# Patient Record
Sex: Female | Born: 1985 | Race: Black or African American | Hispanic: No | Marital: Single | Smoking: Former smoker
Health system: Southern US, Community
[De-identification: ages and names within clinical notes are randomized; demographics above are authoritative.]

## PROBLEM LIST (undated history)

## (undated) DIAGNOSIS — F640 Transsexualism: Secondary | ICD-10-CM

## (undated) DIAGNOSIS — Z789 Other specified health status: Secondary | ICD-10-CM

## (undated) HISTORY — PX: THROAT SURGERY: SHX803

## (undated) HISTORY — PX: TRACHEAL SURGERY: SHX1096

---

## 2012-12-18 ENCOUNTER — Emergency Department (HOSPITAL_COMMUNITY)
Admission: EM | Admit: 2012-12-18 | Discharge: 2012-12-18 | Disposition: A | Discharge status: Home / Self Care | Payer: Self-pay | Attending: Emergency Medicine | Admitting: Emergency Medicine

## 2012-12-18 ENCOUNTER — Encounter (HOSPITAL_COMMUNITY): Payer: Self-pay | Admitting: *Deleted

## 2012-12-18 DIAGNOSIS — J0300 Acute streptococcal tonsillitis, unspecified: Secondary | ICD-10-CM

## 2012-12-18 DIAGNOSIS — F17200 Nicotine dependence, unspecified, uncomplicated: Secondary | ICD-10-CM | POA: Insufficient documentation

## 2012-12-18 DIAGNOSIS — J020 Streptococcal pharyngitis: Secondary | ICD-10-CM | POA: Insufficient documentation

## 2012-12-18 LAB — RAPID STREP SCREEN (MED CTR MEBANE ONLY): Streptococcus, Group A Screen (Direct): POSITIVE — AB

## 2012-12-18 MED ORDER — PENICILLIN V POTASSIUM 500 MG PO TABS: 250.0000 mg | ORAL_TABLET | Freq: Four times a day (QID) | ORAL | Status: DC

## 2012-12-18 MED ORDER — PENICILLIN V POTASSIUM 250 MG PO TABS: 250.0000 mg | ORAL_TABLET | Freq: Four times a day (QID) | ORAL | Status: DC

## 2012-12-18 NOTE — ED Provider Notes (Signed)
History     CSN: [id number]  Arrival date & time 12/18/12  0707   First MD Initiated Contact with Patient 12/18/12 [phone]      Chief Complaint  Patient presents with  . Sore Throat     HPI Pt reports sore throat x several days. Denies fever, denies cough. Pts throat red. Tonsils swollen, not touching. NAD noted.  Patient denies fever.  States she has pain with swallowing.  Able handle secretions.  History reviewed. No pertinent past medical history.  History reviewed. No pertinent past surgical history.  History reviewed. No pertinent family history.  History  Substance Use Topics  . Smoking status: Current Every Day Smoker -- 1.0 packs/day  . Smokeless tobacco: Not on file  . Alcohol Use: No    OB History    Grav Para Term Preterm Abortions TAB SAB Ect Mult Living                  Review of Systems All other systems reviewed and are negative Allergies  Review of patient's allergies indicates no known allergies.  Home Medications   Current Outpatient Rx  Name  Route  Sig  Dispense  Refill  . PENICILLIN V POTASSIUM 500 MG PO TABS   Oral   Take 0.5 tablets (250 mg total) by mouth 4 (four) times daily.   30 tablet   0     BP 140/81  Pulse 93  Temp 98 F (36.7 C) (Oral)  Resp 16  SpO2 99%  Physical Exam  Nursing note and vitals reviewed. Constitutional: She is oriented to person, place, and time. She appears well-developed and well-nourished. No distress.  HENT:  Head: Normocephalic and atraumatic.  Mouth/Throat: Posterior oropharyngeal edema and posterior oropharyngeal erythema present. No oropharyngeal exudate or tonsillar abscesses.  Eyes: Pupils are equal, round, and reactive to light.  Neck: Normal range of motion.  Cardiovascular: Normal rate and intact distal pulses.   Pulmonary/Chest: No respiratory distress.  Abdominal: Normal appearance. She exhibits no distension.  Musculoskeletal: Normal range of motion.  Neurological: She is alert and  oriented to person, place, and time. No cranial nerve deficit.  Skin: Skin is warm and dry. No rash noted.  Psychiatric: She has a normal mood and affect. Her behavior is normal.    ED Course  Procedures (including critical care time)  Labs Reviewed  RAPID STREP SCREEN - Abnormal; Notable for the following:    Streptococcus, Group A Screen (Direct) POSITIVE (*)     All other components within normal limits   No results found.   1. Strep tonsillitis       MDM         Nelia Shi, MD 12/18/12 [phone]

## 2012-12-18 NOTE — ED Notes (Signed)
Pt reports sore throat x several days.  Denies fever, denies cough.  Pts throat red.  Tonsils swollen, not touching.  NAD noted.

## 2013-08-24 ENCOUNTER — Encounter (HOSPITAL_COMMUNITY): Payer: Self-pay | Admitting: *Deleted

## 2013-08-24 ENCOUNTER — Emergency Department (HOSPITAL_COMMUNITY)
Admission: EM | Admit: 2013-08-24 | Discharge: 2013-08-24 | Disposition: A | Discharge status: Home / Self Care | Payer: Self-pay | Attending: Emergency Medicine | Admitting: Emergency Medicine

## 2013-08-24 DIAGNOSIS — Z87891 Personal history of nicotine dependence: Secondary | ICD-10-CM | POA: Insufficient documentation

## 2013-08-24 DIAGNOSIS — R21 Rash and other nonspecific skin eruption: Secondary | ICD-10-CM | POA: Insufficient documentation

## 2013-08-24 MED ORDER — PERMETHRIN 5 % EX CREA: TOPICAL_CREAM | CUTANEOUS | Status: DC

## 2013-08-24 NOTE — ED Notes (Signed)
Pt is here with rash to hands and penis area

## 2013-08-24 NOTE — ED Provider Notes (Signed)
CSN: [id number]     Arrival date & time 08/24/13  1331 History   First MD Initiated Contact with Patient 08/24/13 1358     Chief Complaint  Patient presents with  . Rash   (Consider location/radiation/quality/duration/timing/severity/associated sxs/prior Treatment) HPI Comments: Patient is a 27 year old female presenting to the emergency department with 2 complaints. The first complaint is a pruritic nondraining rash on bilateral hands that has been present for the last 3 weeks since a visit to [place]. Patient states she was seen initially and provided Bactrim for infection which he has been taking but has not noticed any improvement in the pruritic rash. Patient states rash is worse at night. No alleviating factors. Patient's second complaint is a painless, nonerythematous, nondraining lesion found on the shaft of his penis today. Patient states he has never noticed this before and is having no penile, scrotal, testicular pain or swelling. He denies any recent sexual intercourse protected are otherwise stating he has no concern for sexually transmitted diseases. He denies any penile discharge, urinary symptoms, fever or chills.  Patient is a 27 y.o. female presenting with rash.  Rash Associated symptoms: no fever     History reviewed. No pertinent past medical history. History reviewed. No pertinent past surgical history. No family history on file. History  Substance Use Topics  . Smoking status: Former Smoker -- 1.00 packs/day    Quit date: 02/15/2013  . Smokeless tobacco: Not on file  . Alcohol Use: No    Review of Systems  Constitutional: Negative for fever and chills.  Genitourinary: Negative.  Negative for penile swelling, scrotal swelling, penile pain and testicular pain.  Skin: Positive for rash.    Allergies  Review of patient's allergies indicates no known allergies.  Home Medications   Current Outpatient Rx  Name  Route  Sig  Dispense  Refill  . permethrin  (ELIMITE) 5 % cream      Apply to affected area once   60 g   0    BP 156/86  Pulse 80  Temp(Src) 98.9 F (37.2 C) (Oral)  Resp 16  SpO2 99% Physical Exam  Constitutional: He is oriented to person, place, and time. He appears well-developed and well-nourished. No distress.  HENT:  Head: Normocephalic and atraumatic.  Right Ear: External ear normal.  Left Ear: External ear normal.  Nose: Nose normal.  Eyes: Conjunctivae are normal.  Neck: Neck supple.  Pulmonary/Chest: Effort normal.  Genitourinary: Testes normal and penis normal. Circumcised. No phimosis, paraphimosis, hypospadias, penile erythema or penile tenderness. No discharge found.  Small < 0.72mm flesh colored non-tender papule on penis, no ulcers, erythema, warmth.   Neurological: He is alert and oriented to person, place, and time.  Skin: Skin is warm and dry. Rash noted. Rash is maculopapular. He is not diaphoretic.  Small erythematous maculopapules on bilateral hands.   Psychiatric: He has a normal mood and affect.    ED Course  Procedures (including critical care time) Labs Review Labs Reviewed - No data to display Imaging Review No results found.  MDM   1. Rash    Afebrile, NAD, non-toxic appearing, AAOx4. No evidence of SJS or necrotizing fasciitis. Due to pruritic and not painful nature of blisters do not suspect pemphigus vulgaris. Pustules do resemble scabies, along with history. No blisters, no pustules, no warmth, no draining sinus tracts, no superficial abscesses, no bullous impetigo, no vesicles, no desquamation, no target lesions with dusky purpura or a central bulla. Not tender to  touch. Do no suspect sexually transmitted disease causing single flesh colored papule on penis. No other GU findings. Return precautions discussed. Patient is agreeable to plan. Patient d/w with Dr. Blinda Leatherwood, agrees with plan. Patient is stable at time of discharge         Jeannetta Ellis, PA-C 08/24/13 1537

## 2013-08-27 NOTE — ED Provider Notes (Signed)
Medical screening examination/treatment/procedure(s) were performed by non-physician practitioner and as supervising physician I was immediately available for consultation/collaboration.   Gilda Crease, MD 08/27/13 [phone]

## 2014-08-09 ENCOUNTER — Emergency Department (HOSPITAL_COMMUNITY)
Admission: EM | Admit: 2014-08-09 | Discharge: 2014-08-09 | Disposition: A | Discharge status: Home / Self Care | Payer: Self-pay | Attending: Emergency Medicine | Admitting: Emergency Medicine

## 2014-08-09 ENCOUNTER — Encounter (HOSPITAL_COMMUNITY): Payer: Self-pay | Admitting: Emergency Medicine

## 2014-08-09 DIAGNOSIS — N390 Urinary tract infection, site not specified: Secondary | ICD-10-CM | POA: Insufficient documentation

## 2014-08-09 DIAGNOSIS — Z79899 Other long term (current) drug therapy: Secondary | ICD-10-CM | POA: Insufficient documentation

## 2014-08-09 DIAGNOSIS — Z87891 Personal history of nicotine dependence: Secondary | ICD-10-CM | POA: Insufficient documentation

## 2014-08-09 DIAGNOSIS — R369 Urethral discharge, unspecified: Secondary | ICD-10-CM | POA: Insufficient documentation

## 2014-08-09 LAB — URINALYSIS, ROUTINE W REFLEX MICROSCOPIC
Bilirubin Urine: NEGATIVE
GLUCOSE, UA: NEGATIVE mg/dL
HGB URINE DIPSTICK: NEGATIVE
Ketones, ur: 15 mg/dL — AB
Nitrite: NEGATIVE
Protein, ur: NEGATIVE mg/dL
SPECIFIC GRAVITY, URINE: 1.029 (ref 1.005–1.030)
Urobilinogen, UA: 1 mg/dL (ref 0.0–1.0)
pH: 6 (ref 5.0–8.0)

## 2014-08-09 LAB — RPR: RPR Ser Ql: REACTIVE — AB

## 2014-08-09 LAB — URINE MICROSCOPIC-ADD ON

## 2014-08-09 LAB — RPR TITER

## 2014-08-09 MED ORDER — CEFTRIAXONE SODIUM 250 MG IJ SOLR
250.0000 mg | Freq: Once | INTRAMUSCULAR | Status: AC
  Administered 2014-08-09: 250 mg via INTRAMUSCULAR
  Filled 2014-08-09: qty 250

## 2014-08-09 MED ORDER — AZITHROMYCIN 250 MG PO TABS
1000.0000 mg | ORAL_TABLET | Freq: Once | ORAL | Status: AC
  Administered 2014-08-09: 1000 mg via ORAL
  Filled 2014-08-09: qty 4

## 2014-08-09 MED ORDER — CIPROFLOXACIN HCL 500 MG PO TABS
500.0000 mg | ORAL_TABLET | Freq: Two times a day (BID) | ORAL | Status: DC
Start: 2014-08-09 — End: 2014-08-25

## 2014-08-09 MED ORDER — LIDOCAINE HCL (PF) 1 % IJ SOLN
INTRAMUSCULAR | Status: AC
  Filled 2014-08-09: qty 5

## 2014-08-09 NOTE — ED Notes (Signed)
Patient presents stating having a vaginal discharge

## 2014-08-09 NOTE — ED Provider Notes (Signed)
TIME SEEN: 7:34 AM  CHIEF COMPLAINT: Penile discharge  HPI: Patient is a 28 year old transgender female who has not undergone gender reassignment surgery who presents to the emergency department with white/yellow penile discharge that started yesterday. She states she has noticed an uncomfortable feeling when she urinates and burning. No hematuria. She is sexually active with one female partner. Denies fevers, chills, vomiting or diarrhea. Patient reports she has had syphilis in the past has been treated. She states she is concerned she may have a sexually transmitted disease.  Please note, triage notes are incorrect. Patient is not having vaginal discharge. She has not undergone gender reassignment surgery and still has a penis.  ROS: See HPI Constitutional: no fever  Eyes: no drainage  ENT: no runny nose   Cardiovascular:  no chest pain  Resp: no SOB  GI: no vomiting GU:  dysuria Integumentary: no rash  Allergy: no hives  Musculoskeletal: no leg swelling  Neurological: no slurred speech ROS otherwise negative  PAST MEDICAL HISTORY/PAST SURGICAL HISTORY:  History reviewed. No pertinent past medical history.  MEDICATIONS:  Prior to Admission medications   Medication Sig Start Date End Date Taking? Authorizing Provider  permethrin (ELIMITE) 5 % cream Apply to affected area once 08/24/13   Lise Auer Piepenbrink, PA-C    ALLERGIES:  No Known Allergies  SOCIAL HISTORY:  History  Substance Use Topics  . Smoking status: Former Smoker -- 1.00 packs/day    Quit date: 02/15/2013  . Smokeless tobacco: Never Used  . Alcohol Use: Yes     Comment: ocassionally    FAMILY HISTORY: History reviewed. No pertinent family history.  EXAM: BP 140/91  Pulse 75  Temp(Src) 98.6 F (37 C) (Oral)  Resp 18  Ht  (1.753 m)  Wt 225 lb (102.059 kg)  BMI 33.21 kg/m2  SpO2 100% CONSTITUTIONAL: Alert and oriented and responds appropriately to questions. Well-appearing; well-nourished HEAD:  Normocephalic EYES: Conjunctivae clear, PERRL ENT: normal nose; no rhinorrhea; moist mucous membranes; pharynx without lesions noted NECK: Supple, no meningismus, no LAD  CARD: RRR; S1 and S2 appreciated; no murmurs, no clicks, no rubs, no gallops RESP: Normal chest excursion without splinting or tachypnea; breath sounds clear and equal bilaterally; no wheezes, no rhonchi, no rales,  ABD/GI: Normal bowel sounds; non-distended; soft, non-tender, no rebound, no guarding GU:  Normal external genitalia, small amount of clear discharge from patient's penis, no testicular masses or pain with palpation, no scrotal masses, no perineal warmth or erythema or crepitus or induration BACK:  The back appears normal and is non-tender to palpation, there is no CVA tenderness EXT: Normal ROM in all joints; non-tender to palpation; no edema; normal capillary refill; no cyanosis    SKIN: Normal color for age and race; warm NEURO: Moves all extremities equally PSYCH: The patient's mood and manner are appropriate. Grooming and personal hygiene are appropriate.  MEDICAL DECISION MAKING: Patient here with dysuria and penile discharge. Concern for STD. We'll send urinalysis, urine gonorrhea and Chlamydia tests. Have recommended HIV and syphilis tests which patient agrees to. Will empirically treat for coronary Chlamydia with ceftriaxone and azithromycin. Urine culture pending. We'll discharge home on Cipro for possible UTI as well. Have discussed return precautions and supportive care instructions. Patient verbalizes understanding and is comfortable with plan.       Layla Maw Daneshia Tavano, DO 08/09/14 [phone]

## 2014-08-09 NOTE — Discharge Instructions (Signed)
You may have a urinary tract infection. We're treating you with an antibiotic called Cipro twice a day for the next 7 days. Please take all of this medication. There is also concerned that you may have been exposed to sexual transmitted disease. We have tested him for gonorrhea, Chlamydia, syphilis and HIV. We have treated you with medications in the ED for gonorrhea and Chlamydia. Please abstain from sexual activity until you know the results of your test or until your partner has been tested and treated as well.    Urinary Tract Infection Urinary tract infections (UTIs) can develop anywhere along your urinary tract. Your urinary tract is your body's drainage system for removing wastes and extra water. Your urinary tract includes two kidneys, two ureters, a bladder, and a urethra. Your kidneys are a pair of bean-shaped organs. Each kidney is about the size of your fist. They are located below your ribs, one on each side of your spine. CAUSES Infections are caused by microbes, which are microscopic organisms, including fungi, viruses, and bacteria. These organisms are so small that they can only be seen through a microscope. Bacteria are the microbes that most commonly cause UTIs. SYMPTOMS  Symptoms of UTIs may vary by age and gender of the patient and by the location of the infection. Symptoms in [place] women typically include a frequent and intense urge to urinate and a painful, burning feeling in the bladder or urethra during urination. Older women and men are more likely to be tired, shaky, and weak and have muscle aches and abdominal pain. A fever may mean the infection is in your kidneys. Other symptoms of a kidney infection include pain in your back or sides below the ribs, nausea, and vomiting. DIAGNOSIS To diagnose a UTI, your caregiver will ask you about your symptoms. Your caregiver also will ask to provide a urine sample. The urine sample will be tested for bacteria and white blood cells. White  blood cells are made by your body to help fight infection. TREATMENT  Typically, UTIs can be treated with medication. Because most UTIs are caused by a bacterial infection, they usually can be treated with the use of antibiotics. The choice of antibiotic and length of treatment depend on your symptoms and the type of bacteria causing your infection. HOME CARE INSTRUCTIONS  If you were prescribed antibiotics, take them exactly as your caregiver instructs you. Finish the medication even if you feel better after you have only taken some of the medication.  Drink enough water and fluids to keep your urine clear or pale yellow.  Avoid caffeine, tea, and carbonated beverages. They tend to irritate your bladder.  Empty your bladder often. Avoid holding urine for long periods of time.  Empty your bladder before and after sexual intercourse.  After a bowel movement, women should cleanse from front to back. Use each tissue only once. SEEK MEDICAL CARE IF:   You have back pain.  You develop a fever.  Your symptoms do not begin to resolve within 3 days. SEEK IMMEDIATE MEDICAL CARE IF:   You have severe back pain or lower abdominal pain.  You develop chills.  You have nausea or vomiting.  You have continued burning or discomfort with urination. MAKE SURE YOU:   Understand these instructions.  Will watch your condition.  Will get help right away if you are not doing well or get worse. Document Released: 09/13/2005 Document Revised: 06/04/2012 Document Reviewed: 01/12/2012 [facility] Patient Information 2015 [place], [place]. This information is  not intended to replace advice given to you by your health care provider. Make sure you discuss any questions you have with your health care provider.   Possible Sexually Transmitted Disease A sexually transmitted disease (STD) is a disease or infection that may be passed (transmitted) from person to person, usually during sexual activity. This may  happen by way of saliva, semen, blood, vaginal mucus, or urine. Common STDs include:   Gonorrhea.   Chlamydia.   Syphilis.   HIV and AIDS.   Genital herpes.   Hepatitis B and C.   Trichomonas.   Human papillomavirus (HPV).   Pubic lice.   Scabies.  Mites.  Bacterial vaginosis. WHAT ARE CAUSES OF STDs? An STD may be caused by bacteria, a virus, or parasites. STDs are often transmitted during sexual activity if one person is infected. However, they may also be transmitted through nonsexual means. STDs may be transmitted after:   Sexual intercourse with an infected person.   Sharing sex toys with an infected person.   Sharing needles with an infected person or using unclean piercing or tattoo needles.  Having intimate contact with the genitals, mouth, or rectal areas of an infected person.   Exposure to infected fluids during birth. WHAT ARE THE SIGNS AND SYMPTOMS OF STDs? Different STDs have different symptoms. Some people may not have any symptoms. If symptoms are present, they may include:   Painful or bloody urination.   Pain in the pelvis, abdomen, vagina, anus, throat, or eyes.   A skin rash, itching, or irritation.  Growths, ulcerations, blisters, or sores in the genital and anal areas.  Abnormal vaginal discharge with or without bad odor.   Penile discharge in men.   Fever.   Pain or bleeding during sexual intercourse.   Swollen glands in the groin area.   Yellow skin and eyes (jaundice). This is seen with hepatitis.   Swollen testicles.  Infertility.  Sores and blisters in the mouth. HOW ARE STDs DIAGNOSED? To make a diagnosis, your health care provider may:   Take a medical history.   Perform a physical exam.   Take a sample of any discharge to examine.  Swab the throat, cervix, opening to the penis, rectum, or vagina for testing.  Test a sample of your first morning urine.   Perform blood tests.   Perform  a Pap test, if this applies.   Perform a colposcopy.   Perform a laparoscopy.  HOW ARE STDs TREATED? Treatment depends on the STD. Some STDs may be treated but not cured.   Chlamydia, gonorrhea, trichomonas, and syphilis can be cured with antibiotic medicine.   Genital herpes, hepatitis, and HIV can be treated, but not cured, with prescribed medicines. The medicines lessen symptoms.   Genital warts from HPV can be treated with medicine or by freezing, burning (electrocautery), or surgery. Warts may come back.   HPV cannot be cured with medicine or surgery. However, abnormal areas may be removed from the cervix, vagina, or vulva.   If your diagnosis is confirmed, your recent sexual partners need treatment. This is true even if they are symptom-free or have a negative culture or evaluation. They should not have sex until their health care providers say it is okay. HOW CAN I REDUCE MY RISK OF GETTING AN STD? Take these steps to reduce your risk of getting an STD:  Use latex condoms, dental dams, and water-soluble lubricants during sexual activity. Do not use petroleum jelly or oils.  Avoid having  multiple sex partners.  Do not have sex with someone who has other sex partners.  Do not have sex with anyone you do not know or who is at high risk for an STD.  Avoid risky sex practices that can break your skin.  Do not have sex if you have open sores on your mouth or skin.  Avoid drinking too much alcohol or taking illegal drugs. Alcohol and drugs can affect your judgment and put you in a vulnerable position.  Avoid engaging in oral and anal sex acts.  Get vaccinated for HPV and hepatitis. If you have not received these vaccines in the past, talk to your health care provider about whether one or both might be right for you.   If you are at risk of being infected with HIV, it is recommended that you take a prescription medicine daily to prevent HIV infection. This is called  pre-exposure prophylaxis (PrEP). You are considered at risk if:  You are a man who has sex with other men (MSM).  You are a heterosexual man or woman and are sexually active with more than one partner.  You take drugs by injection.  You are sexually active with a partner who has HIV.  Talk with your health care provider about whether you are at high risk of being infected with HIV. If you choose to begin PrEP, you should first be tested for HIV. You should then be tested every 3 months for as long as you are taking PrEP.  WHAT SHOULD I DO IF I THINK I HAVE AN STD?  See your health care provider.   Tell your sexual partner(s). They should be tested and treated for any STDs.  Do not have sex until your health care provider says it is okay. WHEN SHOULD I GET IMMEDIATE MEDICAL CARE? Contact your health care provider right away if:   You have severe abdominal pain.  You are a man and notice swelling or pain in your testicles.  You are a woman and notice swelling or pain in your vagina. Document Released: 02/24/2003 Document Revised: 12/09/2013 Document Reviewed: 06/24/2013 [facility] Patient Information 2015 [place], [place]. This information is not intended to replace advice given to you by your health care provider. Make sure you discuss any questions you have with your health care provider.

## 2014-08-09 NOTE — ED Notes (Signed)
Pt rocephin mixed lidocaine for injection

## 2014-08-10 ENCOUNTER — Telehealth (HOSPITAL_BASED_OUTPATIENT_CLINIC_OR_DEPARTMENT_OTHER): Payer: Self-pay | Admitting: Emergency Medicine

## 2014-08-10 LAB — GC/CHLAMYDIA PROBE AMP
CT Probe RNA: NEGATIVE
GC Probe RNA: POSITIVE — AB

## 2014-08-10 NOTE — Telephone Encounter (Signed)
Post ED Visit - Positive Culture Follow-up: Successful Patient Follow-Up  Culture assessed and recommendations reviewed by:  Wes Dulaney, Pharm.D., BCPS  Celedonio Miyamoto, Pharm.D., BCPS  Georgina Pillion, [address].D., BCPS  [place], [address].D., BCPS, AAHIVP  Estella Husk, Pharm.D., BCPS, AAHIVP  Red Christians, Pharm.D.  Tennis Must, [place].D.  Positive RPR culture   Patient discharged without antimicrobial prescription and treatment is now indicated  Organism is resistant to prescribed ED discharge antimicrobial  Patient with positive blood cultures    chart sent to EDP for treatment  Changes discussed with ED provider:  New antibiotic prescription  Called to   [facility] patient, date   , time    Berle Mull 08/10/2014, 11:50 AM

## 2014-08-11 ENCOUNTER — Telehealth: Payer: Self-pay | Admitting: *Deleted

## 2014-08-11 NOTE — Telephone Encounter (Signed)
Pt requested lab results, aware of labs not sign by PCP will call back

## 2014-08-12 ENCOUNTER — Telehealth (HOSPITAL_BASED_OUTPATIENT_CLINIC_OR_DEPARTMENT_OTHER): Payer: Self-pay | Admitting: Emergency Medicine

## 2014-08-12 ENCOUNTER — Telehealth (HOSPITAL_COMMUNITY): Payer: Self-pay

## 2014-08-12 NOTE — Telephone Encounter (Signed)
Post ED Visit - Positive Culture Follow-up  Culture report reviewed by antimicrobial stewardship pharmacist:  Wes Dulaney, Pharm.D., BCPS  Celedonio Miyamoto, Pharm.D., BCPS  Georgina Pillion, Pharm.D., BCPS  [place], [address].D., BCPS, AAHIVP  Estella Husk, Pharm.D., BCPS, AAHIVP  Red Christians, Pharm.D.  Cassie Roseanne [place], Pharm.D.  Positive gonorrhea culture Treated with Zithromax and Rocephin, organism sensitive to the same and no further patient follow-up is required at this time.  Berle Mull 08/12/2014, 8:44 AM

## 2014-08-24 ENCOUNTER — Encounter (HOSPITAL_COMMUNITY): Payer: Self-pay | Admitting: Emergency Medicine

## 2014-08-24 ENCOUNTER — Emergency Department (HOSPITAL_COMMUNITY)
Admission: EM | Admit: 2014-08-24 | Discharge: 2014-08-24 | Disposition: A | Discharge status: Home / Self Care | Payer: Self-pay | Attending: Emergency Medicine | Admitting: Emergency Medicine

## 2014-08-24 DIAGNOSIS — T7840XA Allergy, unspecified, initial encounter: Secondary | ICD-10-CM

## 2014-08-24 DIAGNOSIS — IMO0002 Reserved for concepts with insufficient information to code with codable children: Secondary | ICD-10-CM | POA: Insufficient documentation

## 2014-08-24 DIAGNOSIS — R21 Rash and other nonspecific skin eruption: Secondary | ICD-10-CM | POA: Insufficient documentation

## 2014-08-24 DIAGNOSIS — Z792 Long term (current) use of antibiotics: Secondary | ICD-10-CM | POA: Insufficient documentation

## 2014-08-24 DIAGNOSIS — I10 Essential (primary) hypertension: Secondary | ICD-10-CM | POA: Insufficient documentation

## 2014-08-24 DIAGNOSIS — Z79899 Other long term (current) drug therapy: Secondary | ICD-10-CM | POA: Insufficient documentation

## 2014-08-24 DIAGNOSIS — Z87891 Personal history of nicotine dependence: Secondary | ICD-10-CM | POA: Insufficient documentation

## 2014-08-24 MED ORDER — PREDNISONE 20 MG PO TABS
40.0000 mg | ORAL_TABLET | Freq: Once | ORAL | Status: AC
  Administered 2014-08-24: 40 mg via ORAL
  Filled 2014-08-24: qty 2

## 2014-08-24 MED ORDER — FAMOTIDINE 20 MG PO TABS: 20.0000 mg | ORAL_TABLET | Freq: Two times a day (BID) | ORAL | Status: DC

## 2014-08-24 MED ORDER — DIPHENHYDRAMINE HCL 25 MG PO TABS: 25.0000 mg | ORAL_TABLET | Freq: Four times a day (QID) | ORAL | Status: DC | PRN

## 2014-08-24 MED ORDER — PREDNISONE 20 MG PO TABS
40.0000 mg | ORAL_TABLET | Freq: Every day | ORAL | Status: DC
Start: 2014-08-24 — End: 2017-09-16

## 2014-08-24 MED ORDER — DIPHENHYDRAMINE HCL 25 MG PO CAPS
25.0000 mg | ORAL_CAPSULE | Freq: Once | ORAL | Status: AC
  Administered 2014-08-24: 25 mg via ORAL
  Filled 2014-08-24: qty 1

## 2014-08-24 NOTE — ED Provider Notes (Signed)
CSN: [id number]     Arrival date & time 08/24/14  3474 History   First MD Initiated Contact with Patient 08/24/14 1010     Chief Complaint  Patient presents with  . Allergic Reaction     (Consider location/radiation/quality/duration/timing/severity/associated sxs/prior Treatment) HPI Comments: 28 year old with a history of no significant allergies who presents with a complaint of having a rash especially on the inside of her right proximal thigh, possible swelling of her lower lip and a feeling of a lump in her throat area and this came on after playing volleyball last night. The patient states that there has been no other topical or ingested exposures that she is aware of. Denies change in voice, wheezing, shortness of breath, chest pain or itching except for that one spot on her inner thigh. Nothing makes this better or worse, no medications given prior to arrival.  Patient is a 28 y.o. female presenting with allergic reaction. The history is provided by the patient.  Allergic Reaction   History reviewed. No pertinent past medical history. Past Surgical History  Procedure Laterality Date  . Transgender     History reviewed. No pertinent family history. History  Substance Use Topics  . Smoking status: Former Smoker -- 1.00 packs/day    Quit date: 02/15/2013  . Smokeless tobacco: Never Used  . Alcohol Use: Yes     Comment: ocassionally    Review of Systems  All other systems reviewed and are negative.     Allergies  Review of patient's allergies indicates no known allergies.  Home Medications   Prior to Admission medications   Medication Sig Start Date End Date Taking? Authorizing Provider  ciprofloxacin (CIPRO) 500 MG tablet Take 1 tablet (500 mg total) by mouth 2 (two) times daily. 08/09/14   Kristen N Ward, DO  diphenhydrAMINE (BENADRYL) 25 MG tablet Take 1 tablet (25 mg total) by mouth every 6 (six) hours as needed for itching (Rash). 08/24/14   Vida Roller, MD   famotidine (PEPCID) 20 MG tablet Take 1 tablet (20 mg total) by mouth 2 (two) times daily. 08/24/14   Vida Roller, MD  permethrin (ELIMITE) 5 % cream Apply to affected area once 08/24/13   Victorino Dike L Piepenbrink, PA-C  predniSONE (DELTASONE) 20 MG tablet Take 2 tablets (40 mg total) by mouth daily. 08/24/14   Vida Roller, MD   BP 143/90  Pulse 76  Temp(Src) 97.6 F (36.4 C) (Oral)  Resp 18  SpO2 97% Physical Exam  Nursing note and vitals reviewed. Constitutional: He appears well-developed and well-nourished. No distress.  HENT:  Head: Normocephalic and atraumatic.  Mouth/Throat: Oropharynx is clear and moist. No oropharyngeal exudate.  Oropharynx is patent, clear, moist without any swelling erythema edema or asymmetry  Eyes: Conjunctivae and EOM are normal. Pupils are equal, round, and reactive to light. Right eye exhibits no discharge. Left eye exhibits no discharge. No scleral icterus.  Neck: Normal range of motion. Neck supple. No JVD present. No thyromegaly present.  Very supple neck with no lymphadenopathy or stiffness  Cardiovascular: Normal rate, regular rhythm, normal heart sounds and intact distal pulses.  Exam reveals no gallop and no friction rub.   No murmur heard. Pulmonary/Chest: Effort normal and breath sounds normal. No respiratory distress. He has no wheezes. He has no rales.  Musculoskeletal: Normal range of motion. He exhibits no edema and no tenderness.  Lymphadenopathy:    He has no cervical adenopathy.  Neurological: He is alert. Coordination normal.  Skin:  Skin is warm and dry. Rash noted. No erythema.  Small area of urticaria to the right inner thigh  Psychiatric: He has a normal mood and affect. His behavior is normal.    ED Course  Procedures (including critical care time) Labs Review Labs Reviewed - No data to display  Imaging Review No results found.    MDM   Final diagnoses:  Allergic reaction, initial encounter    Possible early allergic  reaction, the patient appears totally stable and has no airway issues. Has been eating and drinking, breathing and speaking without difficulty, no signs of airway swelling or compromise, stable for discharge on the following medications.   Meds given in ED:  Medications  predniSONE (DELTASONE) tablet 40 mg (not administered)  diphenhydrAMINE (BENADRYL) capsule 25 mg (not administered)    New Prescriptions   DIPHENHYDRAMINE (BENADRYL) 25 MG TABLET    Take 1 tablet (25 mg total) by mouth every 6 (six) hours as needed for itching (Rash).   FAMOTIDINE (PEPCID) 20 MG TABLET    Take 1 tablet (20 mg total) by mouth 2 (two) times daily.   PREDNISONE (DELTASONE) 20 MG TABLET    Take 2 tablets (40 mg total) by mouth daily.        Vida Roller, MD 08/24/14 1018

## 2014-08-24 NOTE — ED Notes (Signed)
Per pt sts since last night she began to have hives all over, lip swelling, and feels like she has a lump in her throat. Denies any new meds or foods.

## 2014-08-24 NOTE — Discharge Instructions (Signed)
You have been diagnosed with an allergic reaction. Usually allergic reactions like this are caused by exposures to something that you either ate or touched or smelled.  It may be related to a number of different exposures including a new perfume, topical creams, soaps, detergents, linens, clothing, medications. Occasionally we do not find an answer for why there is an allergic reaction. These are treated the same way including Benadryl as needed for itching and rash. (This can be used up to 50 mg every 6 hours as needed).  Pepcid 20 mg every night and prednisone once a day for 5 days. Please do not take the Benadryl and drive or take care of children or other imported duties as the Benadryl can make you sleepy.   ° °If you should develop severe or worsening symptoms including difficulty breathing, difficulty swallowing, wheezing or increased coughing or a rash that developed on the inside of your mouth or a worsening rash on your skin, return to the hospital immediately for a recheck. Please call your Dr. in the morning for a recheck in 2 days if you are still having symptoms. If you do not have a Dr. see the list below.  If we have identified the source of your allergic reaction, please avoid this at all costs. This means stopping the medication if it is a new medication or a voiding topical exposures such as creams lotions body soaps or deodorants if this is the source. ° °Allergic Reaction, Mild to Moderate °Allergies may happen from anything your body is sensitive to. This may be food, medications, pollens, chemicals, and nearly anything around you in everyday life that produces allergens. An allergen is anything that causes an allergy producing substance. Allergens cause your body to release allergic antibodies. Through a chain of events, they cause a release of histamine into the blood stream. Histamines are meant to protect you, but they also cause your discomfort. This is why antihistamines are often used  for allergies. Heredity is often a factor in causing allergic reactions. This means you may have some of the same allergies as your parents. °Allergies happen in all age groups. You may have some idea of what caused your reaction. There are many allergens around us. It may be difficult to know what caused your reaction. If this is a first time event, it may never happen again. Allergies cannot be cured but can be controlled with medications. °SYMPTOMS  °You may get some or all of the following problems from allergies. °· Swelling and itching in and around the mouth.  °· Tearing, itchy eyes.  °· Nasal congestion and runny nose.  °· Sneezing and coughing.  °· An itchy red rash or hives.  °· Vomiting or diarrhea.  °· Difficulty breathing.  °Seasonal allergies occur in all age groups. They are seasonal because they usually occur during the same season every year. They may be a reaction to molds, grass pollens, or tree pollens. Other causes of allergies are house dust mite allergens, pet dander and mold spores. These are just a common few of the thousands of allergens around us. All of the symptoms listed above happen when you come in contact with pollens and other allergens. Seasonal allergies are usually not life threatening. They are generally more of a nuisance that can often be handled using medications. °Hay fever is a combination of all or some of the above listed allergy problems. It may often be treated with simple over-the-counter medications such as diphenhydramine. Take medication as   directed. Check with your caregiver or package insert for child dosages. °TREATMENT AND HOME CARE INSTRUCTIONS °If hives or rash are present: °· Take medications as directed.  °· You may use an over-the-counter antihistamine (diphenhydramine) for hives and itching as needed. Do not drive or drink alcohol until medications used to treat the reaction have worn off. Antihistamines tend to make people sleepy.  °· Apply cold cloths  (compresses) to the skin or take baths in cool water. This will help itching. Avoid hot baths or showers. Heat will make a rash and itching worse.  °· If your allergies persist and become more severe, and over the counter medications are not effective, there are many new medications your caretaker can prescribe. Immunotherapy or desensitizing injections can be used if all else fails. Follow up with your caregiver if problems continue.  °SEEK MEDICAL CARE IF:  °· Your allergies are becoming progressively more troublesome.  °· You suspect a food allergy. Symptoms generally happen within 30 minutes of eating a food.  °· Your symptoms have not gone away within 2 days or are getting worse.  °· You develop new symptoms.  °· You want to retest yourself or your child with a food or drink you think causes an allergic reaction. Never test yourself or your child of a suspected allergy without being under the watchful eye of your caregivers. A second exposure to an allergen may be life-threatening.  °SEEK IMMEDIATE MEDICAL CARE IF: °· You develop difficulty breathing or wheezing, or have a tight feeling in your chest or throat.  °· You develop a swollen mouth, hives, swelling, or itching all over your body.  °A severe reaction with any of the above problems should be considered life-threatening. If you suddenly develop difficulty breathing call for local emergency medical help. THIS IS AN EMERGENCY. °MAKE SURE YOU:  °· Understand these instructions.  °· Will watch your condition.  °· Will get help right away if you are not doing well or get worse.  °Document Released: 10/01/2007 Document Revised: 11/23/2011 Document Reviewed: 10/01/2007 °ExitCare® Patient Information ©2012 [employer]. ° °RESOURCE GUIDE ° °Chronic Pain Problems: °Contact [facility]  [phone] °Patients need to be referred by their primary care doctor. ° °Insufficient Money for Medicine: °Contact [employer] Way:  call "211" or [facility] 271-5999. ° °No Primary Care Doctor: °- Call [facility]  832-8000 - can help you locate a primary care doctor that  accepts your insurance, provides certain services, etc. °- Physician Referral Service- 1-800-533-3463 ° °Agencies that provide inexpensive medical care: °- [facility]  [phone] °- [facility]  832-7272 °- [facility]  [phone] °- [facility]  832-4777 °- [facility]  [phone] °- [facility]  [phone] ° °Medicaid-accepting [place] Providers: °- [facility]- [address] ° [phone], Mon-Fri 9am-7pm, Sat 9am-1pm °- [facility]- [address], [address] ° [phone] °- [facility]- [address], [address] ° [phone] °- [facility]- [address] [place] [address] ° [phone] °- [facility]- [address], [address], [phone] ° Only accepts [place] Access Medicaid patients after they have their name  applied to their card ° °Self Pay (no insurance) in [place]: °- Sickle Cell Patients: Dr Eric Dean, [facility] ° [address], [phone] °- [facility]- [address] °   [phone] °      -     [facility]- [address] °      -     [facility]- see information above (Speak to Pam H if you do not have insurance) °      -  [facility]- [address], [phone] °      -  [facility] [place]- [address],  [phone] °      -  [facility]- [address] [place] [address], [phone] °      -  Dr Osei-Bonsu-  [address], [place], [phone] °      -  [facility]- [address], [phone] °      -  [facility]- [address] [place] [address], [phone], also [address], [phone] °      -    [facility]- [address], [phone], 1st & 3rd Saturday   every month,  10am-1pm ° °1) Find a Doctor and Pay Out of Pocket °Although you won't have to find out who is covered by your insurance plan, it is a good idea to ask around and get recommendations. You will then need to call the office and see if the doctor you have chosen will accept you as a new patient and what types of options they offer for patients who are self-pay. Some doctors offer discounts or will set up payment plans for their patients who do not have insurance, but you will need to ask so you aren't surprised when you get to your appointment. ° °2) Contact Your Local Health Department °Not all health departments have doctors that can see patients for sick visits, but many do, so it is worth a call to see if yours does. If you don't know where your local health department is, you can check in your phone book. The CDC also has a tool to help you locate your state's health department, and many state websites also have listings of all of their local health departments. ° °3) Find a Walk-in Clinic °If your illness is not likely to be very severe or complicated, you may want to try a walk in clinic. These are popping up all over the country in pharmacies, drugstores, and shopping centers. They're usually staffed by nurse practitioners or physician assistants that have been trained to treat common illnesses and complaints. They're usually fairly quick and inexpensive. However, if you have serious medical issues or chronic medical problems, these are probably not your best option ° °STD Testing °- [facility] [place], [facility], [address], [place], phone [phone] or 1-877-539-9860.  Monday - Friday, call for an appointment. °- [place] [place], [facility], [address], [place], phone [phone] or 1-877-539-9860.  Monday - Friday, call for an appointment. ° °Abuse/Neglect: °- [place] Child Abuse Hotline (336)  641-3795 °- [place] Child Abuse Hotline 800-378-5315 (After Hours) ° °Emergency Shelter:  [facility] (336) 271-5985 ° °Maternity Homes: °- [facility] (336) 275-9566 °- [facility] (704) 372-4663 ° °MRSA Hotline #:   832-7006 ° °[place] Resources ° °[facility] [place]  [employer] [place] [facility] °[address]                   [address]         [address]  °[place]                                               [place]                              [place] °Phone:  349-3220                                  Phone:  [phone]                   Phone:  342-8140 ° °[facility], 342-8316 °- [place] [facility] - [facility]- 1-888-581-9988 °      -     [facility] in [place], [address],                                  336-349-4454, Insurance ° °[place] Child Abuse Hotline °(336) 342-1394 or (336) 342-3537 (After Hours) ° ° °Behavioral Health Services ° °Substance Abuse Resources: °- [facility]  336-882-2125 °- [facility] 336-784-9470 °- [facility] 336-285-9073 °- [facility] 336-845-3988 °- Residential & Outpatient Substance Abuse Program  800-659-3381 ° °Psychological Services: °- [facility]  832-9600 °- [facility]  378-7881 °- [facility], [address], [place], ACCESS LINE: 1-800-853-5163 or 336-641-4981, Http://www.[facility]/services/adult.htm ° °Dental Assistance ° °If unable to pay or uninsured, contact:  [facility] or [facility] to become qualified for the adult dental clinic. ° °Patients with Medicaid: [facility] °[address], [phone] °[address], [phone] ° °If unable to pay, or uninsured, contact [facility] (271-5999) or [place] [facility] (641-3152 in , 842-7733 in [place]) to become qualified for the adult dental clinic ° °Other Low-Cost Community Dental Services: °- [facility]- [address], [place], [place], [address], 723-1848, [address]. [address], 2nd and 4th Thursday of the month at 6:30am.  10 clients each day by appointment, can sometimes see walk-in patients if someone does not show for an appointment. °- [facility]- [address], [place], [place], [address], 723-7904 °- [facility]- [address], [place], [place], [address], [phone] °- [facility]- 342-8273 °- [facility]- [phone] °- [facility]- 570-6415 ° ° ° ° ° °

## 2014-08-25 ENCOUNTER — Emergency Department (HOSPITAL_COMMUNITY)
Admission: EM | Admit: 2014-08-25 | Discharge: 2014-08-25 | Disposition: A | Discharge status: Home / Self Care | Payer: Self-pay | Attending: Emergency Medicine | Admitting: Emergency Medicine

## 2014-08-25 ENCOUNTER — Encounter (HOSPITAL_COMMUNITY): Payer: Self-pay | Admitting: Emergency Medicine

## 2014-08-25 DIAGNOSIS — T7840XD Allergy, unspecified, subsequent encounter: Secondary | ICD-10-CM

## 2014-08-25 DIAGNOSIS — IMO0002 Reserved for concepts with insufficient information to code with codable children: Secondary | ICD-10-CM | POA: Insufficient documentation

## 2014-08-25 DIAGNOSIS — J029 Acute pharyngitis, unspecified: Secondary | ICD-10-CM | POA: Insufficient documentation

## 2014-08-25 DIAGNOSIS — Z87891 Personal history of nicotine dependence: Secondary | ICD-10-CM | POA: Insufficient documentation

## 2014-08-25 DIAGNOSIS — Z792 Long term (current) use of antibiotics: Secondary | ICD-10-CM | POA: Insufficient documentation

## 2014-08-25 DIAGNOSIS — Z79899 Other long term (current) drug therapy: Secondary | ICD-10-CM | POA: Insufficient documentation

## 2014-08-25 LAB — FLUORESCENT TREPONEMAL AB(FTA)-IGG-BLD: Fluorescent Treponemal Ab, IgG: REACTIVE — AB

## 2014-08-25 LAB — URINE CULTURE: Colony Count: 8000

## 2014-08-25 LAB — RAPID STREP SCREEN (MED CTR MEBANE ONLY): Streptococcus, Group A Screen (Direct): NEGATIVE

## 2014-08-25 LAB — HIV ANTIBODY (ROUTINE TESTING W REFLEX): HIV 1&2 Ab, 4th Generation: NONREACTIVE — AB

## 2014-08-25 MED ORDER — METHYLPREDNISOLONE SODIUM SUCC 125 MG IJ SOLR
125.0000 mg | Freq: Once | INTRAMUSCULAR | Status: AC
  Administered 2014-08-25: 125 mg via INTRAMUSCULAR

## 2014-08-25 MED ORDER — METHYLPREDNISOLONE SODIUM SUCC 125 MG IJ SOLR
125.0000 mg | Freq: Once | INTRAMUSCULAR | Status: DC
  Filled 2014-08-25: qty 2

## 2014-08-25 MED ORDER — DIPHENHYDRAMINE HCL 50 MG/ML IJ SOLN
25.0000 mg | Freq: Once | INTRAMUSCULAR | Status: AC
  Administered 2014-08-25: 25 mg via INTRAMUSCULAR
  Filled 2014-08-25: qty 1

## 2014-08-25 NOTE — ED Notes (Signed)
Pt here for lip sweling, seen yesterday for allergic rxn in throat, sts now subsided but has lips swelling, given benadryl, pepcid, and prednisone yesterday

## 2014-08-25 NOTE — Discharge Instructions (Signed)
Continue taking your prednisone, benadryl and pepcid at home. Return to the ED with worsening or concerning symptoms.

## 2014-08-25 NOTE — ED Provider Notes (Signed)
CSN: [id number]     Arrival date & time 08/25/14  0955 History   This chart was scribed for non-physician practitioner Emilia Beck, PA-C, working with Flint Melter, MD, by Yevette Edwards, ED Scribe. This patient was seen in room TR11C/TR11C and the patient's care was started at 10:39 AM. one    Chief Complaint  Patient presents with  . Allergic Reaction    The history is provided by the patient. No language interpreter was used.   HPI Comments: Stacie Burns is a 28 y.o. female who presents to the Emergency Department complaining of an allergic reaction which occurred yesterday and has improved. Gwyneth denies difficulty breathing or worsening of the symptoms. The pt reports the swelling to his lip has not resolved even though he has used the benadryl, pepcid, and prednisone which he was prescribed yesterday in the ED. The pt is unsure what the catalyst of the allergic reaction was. Ciaira also endorses a sore throat and reports a h/o strep throat. The pt denies use of HTN medications.   History reviewed. No pertinent past medical history. Past Surgical History  Procedure Laterality Date  . Transgender     History reviewed. No pertinent family history. History  Substance Use Topics  . Smoking status: Former Smoker -- 1.00 packs/day    Quit date: 02/15/2013  . Smokeless tobacco: Never Used  . Alcohol Use: Yes     Comment: ocassionally    Review of Systems  HENT: Positive for sore throat. Negative for trouble swallowing.   Respiratory: Negative for shortness of breath.   Skin: Negative for rash.  Allergic/Immunologic: Positive for environmental allergies.  All other systems reviewed and are negative.   Allergies  Review of patient's allergies indicates no known allergies.  Home Medications   Prior to Admission medications   Medication Sig Start Date End Date Taking? Authorizing Provider  ciprofloxacin (CIPRO) 500 MG tablet Take 1 tablet (500 mg total) by mouth 2 (two)  times daily. 08/09/14   Kristen N Ward, DO  diphenhydrAMINE (BENADRYL) 25 MG tablet Take 1 tablet (25 mg total) by mouth every 6 (six) hours as needed for itching (Rash). 08/24/14   Vida Roller, MD  famotidine (PEPCID) 20 MG tablet Take 1 tablet (20 mg total) by mouth 2 (two) times daily. 08/24/14   Vida Roller, MD  permethrin (ELIMITE) 5 % cream Apply to affected area once 08/24/13   Victorino Dike L Piepenbrink, PA-C  predniSONE (DELTASONE) 20 MG tablet Take 2 tablets (40 mg total) by mouth daily. 08/24/14   Vida Roller, MD   Triage Vitals: BP 136/80  Pulse 78  Temp(Src) 98.1 F (36.7 C) (Oral)  Resp 18  Ht  (1.753 m)  Wt 230 lb (104.327 kg)  BMI 33.95 kg/m2  SpO2 95%  Physical Exam  Nursing note and vitals reviewed. Constitutional: He is oriented to person, place, and time. He appears well-developed and well-nourished. No distress.  HENT:  Head: Normocephalic and atraumatic.  Mouth/Throat: Oropharynx is clear and moist. No oropharyngeal exudate.  Upper and lower edema of lips. No throat closing.   Eyes: Conjunctivae and EOM are normal.  Neck: Neck supple. No tracheal deviation present.  Cardiovascular: Normal rate.   Pulmonary/Chest: Effort normal. No respiratory distress.  No difficulty breathing. No wheezing.   Musculoskeletal: Normal range of motion.  Neurological: He is alert and oriented to person, place, and time.  Skin: Skin is warm and dry.  No rash noted.   Psychiatric: He  has a normal mood and affect. His behavior is normal.    ED Course  Procedures (including critical care time)  DIAGNOSTIC STUDIES: Oxygen Saturation is 95% on room air, normal by my interpretation.    COORDINATION OF CARE:  10:45 AM- Discussed treatment plan with patient, and the patient agreed to the plan. The plan includes Benadryl and prednisone IM.   Labs Review Labs Reviewed - No data to display  Imaging Review No results found.   EKG Interpretation None      MDM   Final  diagnoses:  Allergic reaction, subsequent encounter    12:23 PM Patient's symptoms have slightly improved since being in the ED after 1 hour observed. Vitals stable and patient afebrile. Patient is able to swallow and breath without difficulty. Patient instructed to continue taking prednisone, benadryl, and pepcid at home. Patient instructed to return with worsening or concerning symptoms.   I personally performed the services described in this documentation, which was scribed in my presence. The recorded information has been reviewed and is accurate.    Emilia Beck, PA-C 08/25/14 1224

## 2014-08-27 LAB — CULTURE, GROUP A STREP

## 2014-08-28 NOTE — ED Provider Notes (Signed)
Medical screening examination/treatment/procedure(s) were performed by non-physician practitioner and as supervising physician I was immediately available for consultation/collaboration.  Flint Melter, MD 08/28/14 [id number]

## 2015-06-07 ENCOUNTER — Emergency Department (HOSPITAL_COMMUNITY)
Admission: EM | Admit: 2015-06-07 | Discharge: 2015-06-08 | Disposition: A | Discharge status: Home / Self Care | Payer: Self-pay | Attending: Emergency Medicine | Admitting: Emergency Medicine

## 2015-06-07 ENCOUNTER — Encounter (HOSPITAL_COMMUNITY): Payer: Self-pay | Admitting: Family Medicine

## 2015-06-07 ENCOUNTER — Emergency Department (HOSPITAL_COMMUNITY): Payer: Self-pay

## 2015-06-07 DIAGNOSIS — R1013 Epigastric pain: Secondary | ICD-10-CM | POA: Insufficient documentation

## 2015-06-07 DIAGNOSIS — Z87891 Personal history of nicotine dependence: Secondary | ICD-10-CM | POA: Insufficient documentation

## 2015-06-07 DIAGNOSIS — Z3202 Encounter for pregnancy test, result negative: Secondary | ICD-10-CM | POA: Insufficient documentation

## 2015-06-07 DIAGNOSIS — Z793 Long term (current) use of hormonal contraceptives: Secondary | ICD-10-CM | POA: Insufficient documentation

## 2015-06-07 LAB — COMPREHENSIVE METABOLIC PANEL
ALBUMIN: 4.6 g/dL (ref 3.5–5.0)
ALK PHOS: 69 U/L (ref 38–126)
ALT: 18 U/L (ref 14–54)
ANION GAP: 10 (ref 5–15)
AST: 19 U/L (ref 15–41)
BILIRUBIN TOTAL: 0.5 mg/dL (ref 0.3–1.2)
BUN: 12 mg/dL (ref 6–20)
CO2: 25 mmol/L (ref 22–32)
CREATININE: 0.99 mg/dL (ref 0.44–1.00)
Calcium: 9.5 mg/dL (ref 8.9–10.3)
Chloride: 102 mmol/L (ref 101–111)
GFR calc Af Amer: >60 mL/min (ref >60)
GFR calc non Af Amer: >60 mL/min (ref >60)
GLUCOSE: 85 mg/dL (ref 65–99)
Potassium: 3.4 mmol/L — ABNORMAL LOW (ref 3.5–5.1)
SODIUM: 137 mmol/L (ref 135–145)
Total Protein: 7.9 g/dL (ref 6.5–8.1)

## 2015-06-07 LAB — CBC WITH DIFFERENTIAL/PLATELET
BASOS ABS: 0 10*3/uL (ref 0.0–0.1)
BASOS PCT: 0 % (ref 0–1)
Eosinophils Absolute: 0 10*3/uL (ref 0.0–0.7)
Eosinophils Relative: 0 % (ref 0–5)
HCT: 46.2 % — ABNORMAL HIGH (ref 36.0–46.0)
Hemoglobin: 16.6 g/dL — ABNORMAL HIGH (ref 12.0–15.0)
LYMPHS PCT: 23 % (ref 12–46)
Lymphs Abs: 2.2 10*3/uL (ref 0.7–4.0)
MCH: 29.9 pg (ref 26.0–34.0)
MCHC: 35.9 g/dL (ref 30.0–36.0)
MCV: 83.2 fL (ref 78.0–100.0)
Monocytes Absolute: 0.7 10*3/uL (ref 0.1–1.0)
Monocytes Relative: 8 % (ref 3–12)
NEUTROS ABS: 6.5 10*3/uL (ref 1.7–7.7)
NEUTROS PCT: 69 % (ref 43–77)
PLATELETS: 229 10*3/uL (ref 150–400)
RBC: 5.55 MIL/uL — ABNORMAL HIGH (ref 3.87–5.11)
RDW: 12.8 % (ref 11.5–15.5)
WBC: 9.5 10*3/uL (ref 4.0–10.5)

## 2015-06-07 LAB — URINALYSIS, ROUTINE W REFLEX MICROSCOPIC
Glucose, UA: NEGATIVE mg/dL
HGB URINE DIPSTICK: NEGATIVE
Ketones, ur: >80 mg/dL — AB
LEUKOCYTES UA: NEGATIVE
Nitrite: NEGATIVE
PROTEIN: NEGATIVE mg/dL
SPECIFIC GRAVITY, URINE: 1.036 — AB (ref 1.005–1.030)
UROBILINOGEN UA: 0.2 mg/dL (ref 0.0–1.0)
pH: 5.5 (ref 5.0–8.0)

## 2015-06-07 LAB — LIPASE, BLOOD: Lipase: 44 U/L (ref 22–51)

## 2015-06-07 LAB — POC URINE PREG, ED: Preg Test, Ur: NEGATIVE

## 2015-06-07 MED ORDER — GI COCKTAIL ~~LOC~~
30.0000 mL | Freq: Once | ORAL | Status: AC
  Administered 2015-06-07: 30 mL via ORAL
  Filled 2015-06-07: qty 30

## 2015-06-07 NOTE — ED Notes (Signed)
Patient transported to X-ray 

## 2015-06-07 NOTE — ED Notes (Addendum)
Pt here for abd pain. sts she has been having a "sour stomach". sts she has had similar before and it was acid reflux. Pt sts she has been taking acid reducers and helping.

## 2015-06-07 NOTE — ED Provider Notes (Signed)
CSN: [id number]     Arrival date & time 06/07/15  1718 History   First MD Initiated Contact with Patient 06/07/15 1902     Chief Complaint  Patient presents with  . Abdominal Pain     (Consider location/radiation/quality/duration/timing/severity/associated sxs/prior Treatment) HPI  Stacie Burns is a 29 y.o. female presenting with "sour stomach" the last 5 days after traveling to [place] immediately a lot of rich foods as well as his foods. Patient has taken Pepto-Bismol without relief. She started taking famotidine last night he reports no further pain. Patient denies nausea, vomiting, diarrhea. She denies hematemesis or hematochezia. No melanotic stool. Last BM today. Patient denies hematuria. 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 History of abdominal surgeries. Pt denies NSAID or excessive alcohol use.   History reviewed. No pertinent past medical history. Past Surgical History  Procedure Laterality Date  . Transgender     History reviewed. No pertinent family history. History  Substance Use Topics  . Smoking status: Former Smoker -- 1.00 packs/day    Quit date: 02/15/2013  . Smokeless tobacco: Never Used  . Alcohol Use: Yes     Comment: ocassionally   OB History    No data available     Review of Systems 10 Systems reviewed and are negative for acute change except as noted in the HPI.    Allergies  Review of patient's allergies indicates no known allergies.  Home Medications   Prior to Admission medications   Medication Sig Start Date End Date Taking? Authorizing Provider  estradiol (ESTRACE) 2 MG tablet Take 2 mg by mouth 2 (two) times daily.   Yes Historical Provider, MD  diphenhydrAMINE (BENADRYL) 25 MG tablet Take 1 tablet (25 mg total) by mouth every 6 (six) hours as needed for itching (Rash). Patient not taking: Reported on 06/07/2015 08/24/14   Eber Hong, MD  famotidine (PEPCID) 20 MG tablet Take 1 tablet (20 mg total) by mouth 2 (two) times  daily. Patient not taking: Reported on 06/07/2015 08/24/14   Eber Hong, MD  predniSONE (DELTASONE) 20 MG tablet Take 2 tablets (40 mg total) by mouth daily. Patient not taking: Reported on 06/07/2015 08/24/14   Eber Hong, MD   BP 131/79 mmHg  Pulse 70  Temp(Src) 98.7 F (37.1 C) (Oral)  Resp 20  SpO2 97% Physical Exam  Constitutional: She appears well-developed and well-nourished. No distress.  HENT:  Head: Normocephalic and atraumatic.  Mouth/Throat: Oropharynx is clear and moist.  Eyes: Conjunctivae and EOM are normal. Right eye exhibits no discharge. Left eye exhibits no discharge.  Cardiovascular: Normal rate and regular rhythm.   Pulmonary/Chest: Effort normal and breath sounds normal. No respiratory distress. She has no wheezes.  Abdominal: Soft. Bowel sounds are normal. She exhibits no distension. There is no tenderness.  Neurological: She is alert. She exhibits normal muscle tone. Coordination normal.  Skin: Skin is warm and dry. She is not diaphoretic.  Nursing note and vitals reviewed.   ED Course  Procedures (including critical care time) Labs Review Labs Reviewed  URINALYSIS, ROUTINE W REFLEX MICROSCOPIC (NOT AT [facility]) - Abnormal; Notable for the following:    Specific Gravity, Urine 1.036 (*)    Bilirubin Urine SMALL (*)    Ketones, ur >80 (*)    All other components within normal limits  CBC WITH DIFFERENTIAL/PLATELET - Abnormal; Notable for the following:    RBC 5.55 (*)    Hemoglobin 16.6 (*)    HCT 46.2 (*)    All  other components within normal limits  COMPREHENSIVE METABOLIC PANEL - Abnormal; Notable for the following:    Potassium 3.4 (*)    All other components within normal limits  LIPASE, BLOOD  POC URINE PREG, ED    Imaging Review Dg Abd Acute W/chest  06/07/2015   CLINICAL DATA:  29 year old female with abdominal discomfort. Possible reflux.  EXAM: DG ABDOMEN ACUTE W/ 1V CHEST  COMPARISON:  No priors.  FINDINGS: Lung volumes are normal. No  consolidative airspace disease. No pleural effusions. No pneumothorax. No pulmonary nodule or mass noted. Pulmonary vasculature and the cardiomediastinal silhouette are within normal limits.  Gas and stool are seen scattered throughout the colon extending to the level of the distal rectum. No pathologic distension of small bowel is noted. No gross evidence of pneumoperitoneum.  IMPRESSION: 1.  Nonobstructive bowel gas pattern. 2. No pneumoperitoneum. 3. No radiographic evidence of acute cardiopulmonary disease.   Electronically Signed   By: Trudie Reed M.D.   On: 06/07/2015 21:18     EKG Interpretation None      MDM   Final diagnoses:  Epigastric abdominal pain   Patient presenting with epigastric abdominal tenderness worse with eating. VSS. No abdominal tenderness on exam. Lab workup reassuring. Acute abdominal series without acute abnormalities. Patient likely with acid reflux/GERD. She's been encouraged to take famotidine daily and for worsening, OTC Prilosec. Patient nontoxic well-appearing and afebrile. Patient stable for discharge.  Discussed return precautions with patient. Discussed all results and patient verbalizes understanding and agrees with plan.    Oswaldo Conroy, PA-C 06/08/15 0034  Lorre Nick, MD 06/14/15 [phone]

## 2015-06-07 NOTE — Discharge Instructions (Signed)
Return to the emergency room with worsening of symptoms, new symptoms or with symptoms that are concerning , especially fevers, abdominal pain in one area, unable to keep down fluids, blood in stool or vomit, severe pain, you feel faint, lightheaded or pass out. Continue to take famotidine daily for 2 weeks.  Call to make follow up appointment with the wellness center. Number provided above. Read below information and follow recommendations.   Food Choices for Gastroesophageal Reflux Disease When you have gastroesophageal reflux disease (GERD), the foods you eat and your eating habits are very important. Choosing the right foods can help ease the discomfort of GERD. WHAT GENERAL GUIDELINES DO I NEED TO FOLLOW?  Choose fruits, vegetables, whole grains, low-fat dairy products, and low-fat meat, fish, and poultry.  Limit fats such as oils, salad dressings, butter, nuts, and avocado.  Keep a food diary to identify foods that cause symptoms.  Avoid foods that cause reflux. These may be different for different people.  Eat frequent small meals instead of three large meals each day.  Eat your meals slowly, in a relaxed setting.  Limit fried foods.  Cook foods using methods other than frying.  Avoid drinking alcohol.  Avoid drinking large amounts of liquids with your meals.  Avoid bending over or lying down until 2-3 hours after eating. WHAT FOODS ARE NOT RECOMMENDED? The following are some foods and drinks that may worsen your symptoms: Vegetables Tomatoes. Tomato juice. Tomato and spaghetti sauce. Chili peppers. Onion and garlic. Horseradish. Fruits Oranges, grapefruit, and lemon (fruit and juice). Meats High-fat meats, fish, and poultry. This includes hot dogs, ribs, ham, sausage, salami, and bacon. Dairy Whole milk and chocolate milk. Sour cream. Cream. Butter. Ice cream. Cream cheese.  Beverages Coffee and tea, with or without caffeine. Carbonated beverages or energy  drinks. Condiments Hot sauce. Barbecue sauce.  Sweets/Desserts Chocolate and cocoa. Donuts. Peppermint and spearmint. Fats and Oils High-fat foods, including [place] fries and potato chips. Other Vinegar. Strong spices, such as black pepper, white pepper, red pepper, cayenne, curry powder, cloves, ginger, and chili powder. The items listed above may not be a complete list of foods and beverages to avoid. Contact your dietitian for more information. Document Released: 12/04/2005 Document Revised: 12/09/2013 Document Reviewed: 10/08/2013 [facility] Patient Information 2015 [place], [place]. This information is not intended to replace advice given to you by your health care provider. Make sure you discuss any questions you have with your health care provider.  Gastroesophageal Reflux Disease, Adult Gastroesophageal reflux disease (GERD) happens when acid from your stomach flows up into the esophagus. When acid comes in contact with the esophagus, the acid causes soreness (inflammation) in the esophagus. Over time, GERD may create small holes (ulcers) in the lining of the esophagus. CAUSES   Increased body weight. This puts pressure on the stomach, making acid rise from the stomach into the esophagus.  Smoking. This increases acid production in the stomach.  Drinking alcohol. This causes decreased pressure in the lower esophageal sphincter (valve or ring of muscle between the esophagus and stomach), allowing acid from the stomach into the esophagus.  Late evening meals and a full stomach. This increases pressure and acid production in the stomach.  A malformed lower esophageal sphincter. Sometimes, no cause is found. SYMPTOMS   Burning pain in the lower part of the mid-chest behind the breastbone and in the mid-stomach area. This may occur twice a week or more often.  Trouble swallowing.  Sore throat.  Dry cough.  Asthma-like  symptoms including chest tightness, shortness of breath, or  wheezing. DIAGNOSIS  Your caregiver may be able to diagnose GERD based on your symptoms. In some cases, X-rays and other tests may be done to check for complications or to check the condition of your stomach and esophagus. TREATMENT  Your caregiver may recommend over-the-counter or prescription medicines to help decrease acid production. Ask your caregiver before starting or adding any new medicines.  HOME CARE INSTRUCTIONS   Change the factors that you can control. Ask your caregiver for guidance concerning weight loss, quitting smoking, and alcohol consumption.  Avoid foods and drinks that make your symptoms worse, such as:  Caffeine or alcoholic drinks.  Chocolate.  Peppermint or mint flavorings.  Garlic and onions.  Spicy foods.  Citrus fruits, such as oranges, lemons, or limes.  Tomato-based foods such as sauce, chili, salsa, and pizza.  Fried and fatty foods.  Avoid lying down for the 3 hours prior to your bedtime or prior to taking a nap.  Eat small, frequent meals instead of large meals.  Wear loose-fitting clothing. Do not wear anything tight around your waist that causes pressure on your stomach.  Raise the head of your bed 6 to 8 inches with wood blocks to help you sleep. Extra pillows will not help.  Only take over-the-counter or prescription medicines for pain, discomfort, or fever as directed by your caregiver.  Do not take aspirin, ibuprofen, or other nonsteroidal anti-inflammatory drugs (NSAIDs). SEEK IMMEDIATE MEDICAL CARE IF:   You have pain in your arms, neck, jaw, teeth, or back.  Your pain increases or changes in intensity or duration.  You develop nausea, vomiting, or sweating (diaphoresis).  You develop shortness of breath, or you faint.  Your vomit is green, yellow, black, or looks like coffee grounds or blood.  Your stool is red, bloody, or black. These symptoms could be signs of other problems, such as heart disease, gastric bleeding, or  esophageal bleeding. MAKE SURE YOU:   Understand these instructions.  Will watch your condition.  Will get help right away if you are not doing well or get worse. Document Released: 09/13/2005 Document Revised: 02/26/2012 Document Reviewed: 06/23/2011 [facility] Patient Information 2015 [place], [place]. This information is not intended to replace advice given to you by your health care provider. Make sure you discuss any questions you have with your health care provider.

## 2015-08-19 ENCOUNTER — Encounter (HOSPITAL_COMMUNITY): Payer: Self-pay | Admitting: Emergency Medicine

## 2015-08-19 ENCOUNTER — Emergency Department (HOSPITAL_COMMUNITY)
Admission: EM | Admit: 2015-08-19 | Discharge: 2015-08-19 | Disposition: A | Discharge status: Home / Self Care | Payer: Self-pay | Attending: Emergency Medicine | Admitting: Emergency Medicine

## 2015-08-19 DIAGNOSIS — Z79899 Other long term (current) drug therapy: Secondary | ICD-10-CM | POA: Insufficient documentation

## 2015-08-19 DIAGNOSIS — H00015 Hordeolum externum left lower eyelid: Secondary | ICD-10-CM | POA: Insufficient documentation

## 2015-08-19 DIAGNOSIS — H00016 Hordeolum externum left eye, unspecified eyelid: Secondary | ICD-10-CM

## 2015-08-19 DIAGNOSIS — Z87891 Personal history of nicotine dependence: Secondary | ICD-10-CM | POA: Insufficient documentation

## 2015-08-19 MED ORDER — ERYTHROMYCIN 5 MG/GM OP OINT: TOPICAL_OINTMENT | OPHTHALMIC | Status: DC

## 2015-08-19 NOTE — ED Provider Notes (Signed)
CSN: [id number]     Arrival date & time 08/19/15  0700 History   First MD Initiated Contact with Patient 08/19/15 [phone]     Chief Complaint  Patient presents with  . Eye Pain     (Consider location/radiation/quality/duration/timing/severity/associated sxs/prior Treatment) The history is provided by the patient.   Pt presents with small irritated, painful lesion on the left lower lid that began approximately 1 week ago after using an eyeliner she hadn't used for awhile.  Has been using warm compresses without relief.  Denies fevers, URI symptoms, eye discharge, globe pain/itching/discomfort, difficulty moving eyes, change in vision.  She has an event in [place] coming up and would like for this lesion to be gone.    History reviewed. No pertinent past medical history. Past Surgical History  Procedure Laterality Date  . Transgender     No family history on file. Social History  Substance Use Topics  . Smoking status: Former Smoker -- 1.00 packs/day    Quit date: 02/15/2013  . Smokeless tobacco: Never Used  . Alcohol Use: Yes     Comment: ocassionally, socially   OB History    No data available     Review of Systems  Constitutional: Negative for fever and chills.  HENT: Negative for congestion, dental problem, ear pain, mouth sores, rhinorrhea, sore throat and trouble swallowing.   Eyes: Negative for photophobia, pain, discharge, redness, itching and visual disturbance.  Respiratory: Negative for cough, shortness of breath, wheezing and stridor.   Cardiovascular: Negative for chest pain.  Musculoskeletal: Negative for neck pain and neck stiffness.  Skin: Negative for rash.  Allergic/Immunologic: Negative for immunocompromised state.  Hematological: Does not bruise/bleed easily.      Allergies  Review of patient's allergies indicates no known allergies.  Home Medications   Prior to Admission medications   Medication Sig Start Date End Date Taking? Authorizing Provider    diphenhydrAMINE (BENADRYL) 25 MG tablet Take 1 tablet (25 mg total) by mouth every 6 (six) hours as needed for itching (Rash). Patient not taking: Reported on 06/07/2015 08/24/14   Eber Hong, MD  estradiol (ESTRACE) 2 MG tablet Take 2 mg by mouth 2 (two) times daily.    Historical Provider, MD  famotidine (PEPCID) 20 MG tablet Take 1 tablet (20 mg total) by mouth 2 (two) times daily. Patient not taking: Reported on 06/07/2015 08/24/14   Eber Hong, MD  predniSONE (DELTASONE) 20 MG tablet Take 2 tablets (40 mg total) by mouth daily. Patient not taking: Reported on 06/07/2015 08/24/14   Eber Hong, MD   BP 130/74 mmHg  Pulse 74  Temp(Src) 98.2 F (36.8 C) (Oral)  Resp 16  Ht  (1.753 m)  Wt 215 lb (97.523 kg)  BMI 31.74 kg/m2  SpO2 100% Physical Exam  Constitutional: She appears well-developed and well-nourished. No distress.  HENT:  Head: Normocephalic and atraumatic.  Eyes: Conjunctivae and EOM are normal. Pupils are equal, round, and reactive to light. Right eye exhibits no discharge. Left eye exhibits hordeolum. Left eye exhibits no discharge. No scleral icterus.    Neck: Neck supple.  Pulmonary/Chest: Effort normal.  Neurological: She is alert.  Skin: She is not diaphoretic.  Nursing note and vitals reviewed.   ED Course  Procedures (including critical care time) Labs Review Labs Reviewed - No data to display  Imaging Review No results found. I have personally reviewed and evaluated these images and lab results as part of my medical decision-making.   EKG Interpretation None  MDM   Final diagnoses:  Hordeolum externum (stye), left    Afebrile, nontoxic patient with single stye to left lower lid.  No other concerns or abnormalities on exam.  Discussed conservative management but pt is eager to have lesion go away quickly - information provided.   D/C home with erythromycin ointment, ophthalmology follow up for worsening symptoms.   Discussed result,  findings, treatment, and follow up  with patient.  Pt given return precautions.  Pt verbalizes understanding and agrees with plan.         Trixie Dredge, PA-C 08/19/15 1157  Lorre Nick, MD 08/20/15 [phone]

## 2015-08-19 NOTE — Discharge Instructions (Signed)
Read the information below.  You may return to the Emergency Department at any time for worsening condition or any new symptoms that concern you.   If you develop worsening pain in your eye, change in your vision, swelling around your eye, difficulty moving your eye, or fevers greater than 100.4, see your eye doctor or return to the Emergency Department immediately for a recheck.   ° ° °Sty °A sty (hordeolum) is an infection of a gland in the eyelid located at the base of the eyelash. A sty may develop a white or yellow head of pus. It can be puffy (swollen). Usually, the sty will burst and pus will come out on its own. They do not leave lumps in the eyelid once they drain. °A sty is often confused with another form of cyst of the eyelid called a chalazion. Chalazions occur within the eyelid and not on the edge where the bases of the eyelashes are. They often are red, sore and then form firm lumps in the eyelid. °CAUSES  °· Germs (bacteria). °· Lasting (chronic) eyelid inflammation. °SYMPTOMS  °· Tenderness, redness and swelling along the edge of the eyelid at the base of the eyelashes. °· Sometimes, there is a white or yellow head of pus. It may or may not drain. °DIAGNOSIS  °An ophthalmologist will be able to distinguish between a sty and a chalazion and treat the condition appropriately.  °TREATMENT  °· Styes are typically treated with warm packs (compresses) until drainage occurs. °· In rare cases, medicines that kill germs (antibiotics) may be prescribed. These antibiotics may be in the form of drops, cream or pills. °· If a hard lump has formed, it is generally necessary to do a small incision and remove the hardened contents of the cyst in a minor surgical procedure done in the office. °· In suspicious cases, your caregiver may send the contents of the cyst to the lab to be certain that it is not a rare, but dangerous form of cancer of the glands of the eyelid. °HOME CARE INSTRUCTIONS  °· Wash your hands  often and dry them with a clean towel. Avoid touching your eyelid. This may spread the infection to other parts of the eye. °· Apply heat to your eyelid for 10 to 20 minutes, several times a day, to ease pain and help to heal it faster. °· Do not squeeze the sty. Allow it to drain on its own. Wash your eyelid carefully 3 to 4 times per day to remove any pus. °SEEK IMMEDIATE MEDICAL CARE IF:  °· Your eye becomes painful or puffy (swollen). °· Your vision changes. °· Your sty does not drain by itself within 3 days. °· Your sty comes back within a short period of time, even with treatment. °· You have redness (inflammation) around the eye. °· You have a fever. °Document Released: 09/13/2005 Document Revised: 02/26/2012 Document Reviewed: 03/20/2014 °[employer]® Patient Information ©2015 [employer]. This information is not intended to replace advice given to you by your health care provider. Make sure you discuss any questions you have with your health care provider. ° °

## 2015-08-19 NOTE — ED Notes (Signed)
Pt advised of the importance of hand hygiene and refraining from using eye makeup for at least a week.  Warm compresses suggested to patient as directed by provider, however patient states she "doesnt have time to hold compresses on her eye."  Patient also directed on use of ointment she was prescribed, teach-back utilized.  Patient denies further questions.  Patient was discharged with no acute distress noted, completely ambulatory with no apparent difficulty, and alert and completely oriented.

## 2015-08-19 NOTE — ED Notes (Signed)
Pt c/o left eye pain x 2 days, woke up this morning with redness left eye, some swelling to lower left eyelid.  No purulent drainage noted, no foreign bodies noted. No acute distress noted.

## 2016-07-02 IMAGING — CR DG ABDOMEN ACUTE W/ 1V CHEST
3 series · 3 of 3 positions shown · non-contrast
Comparison: No priors.

CLINICAL DATA: 29-year-old female with abdominal discomfort.
Possible reflux.

EXAM:
DG ABDOMEN ACUTE W/ 1V CHEST

[chest pa]
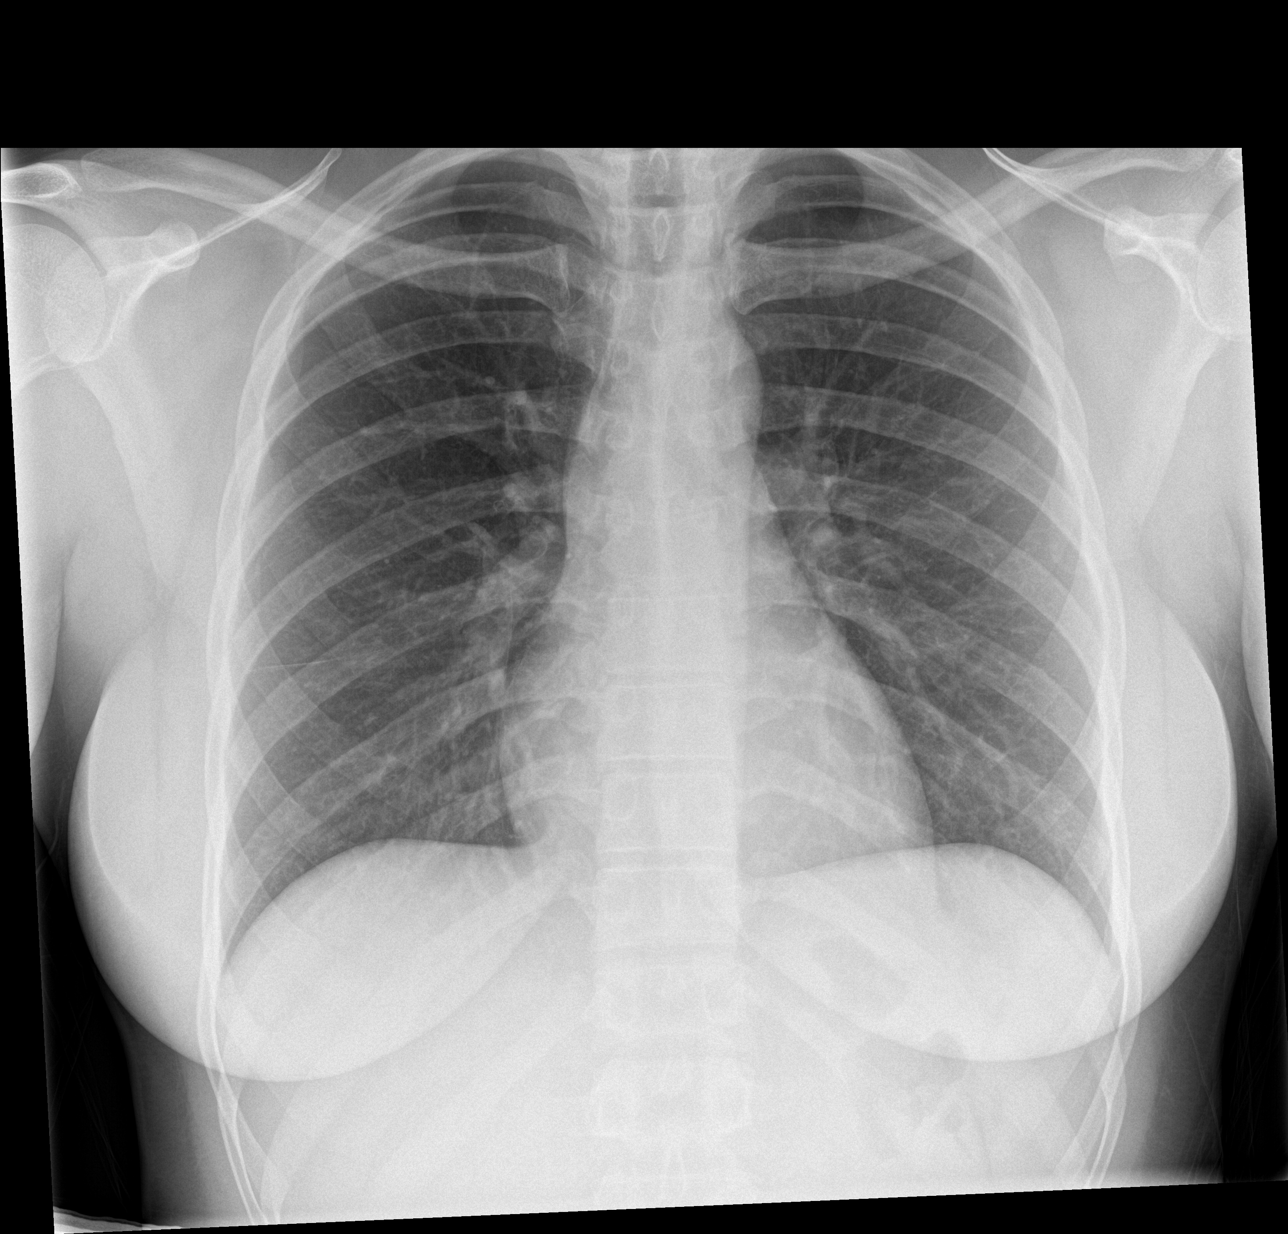

[abdomen erect]
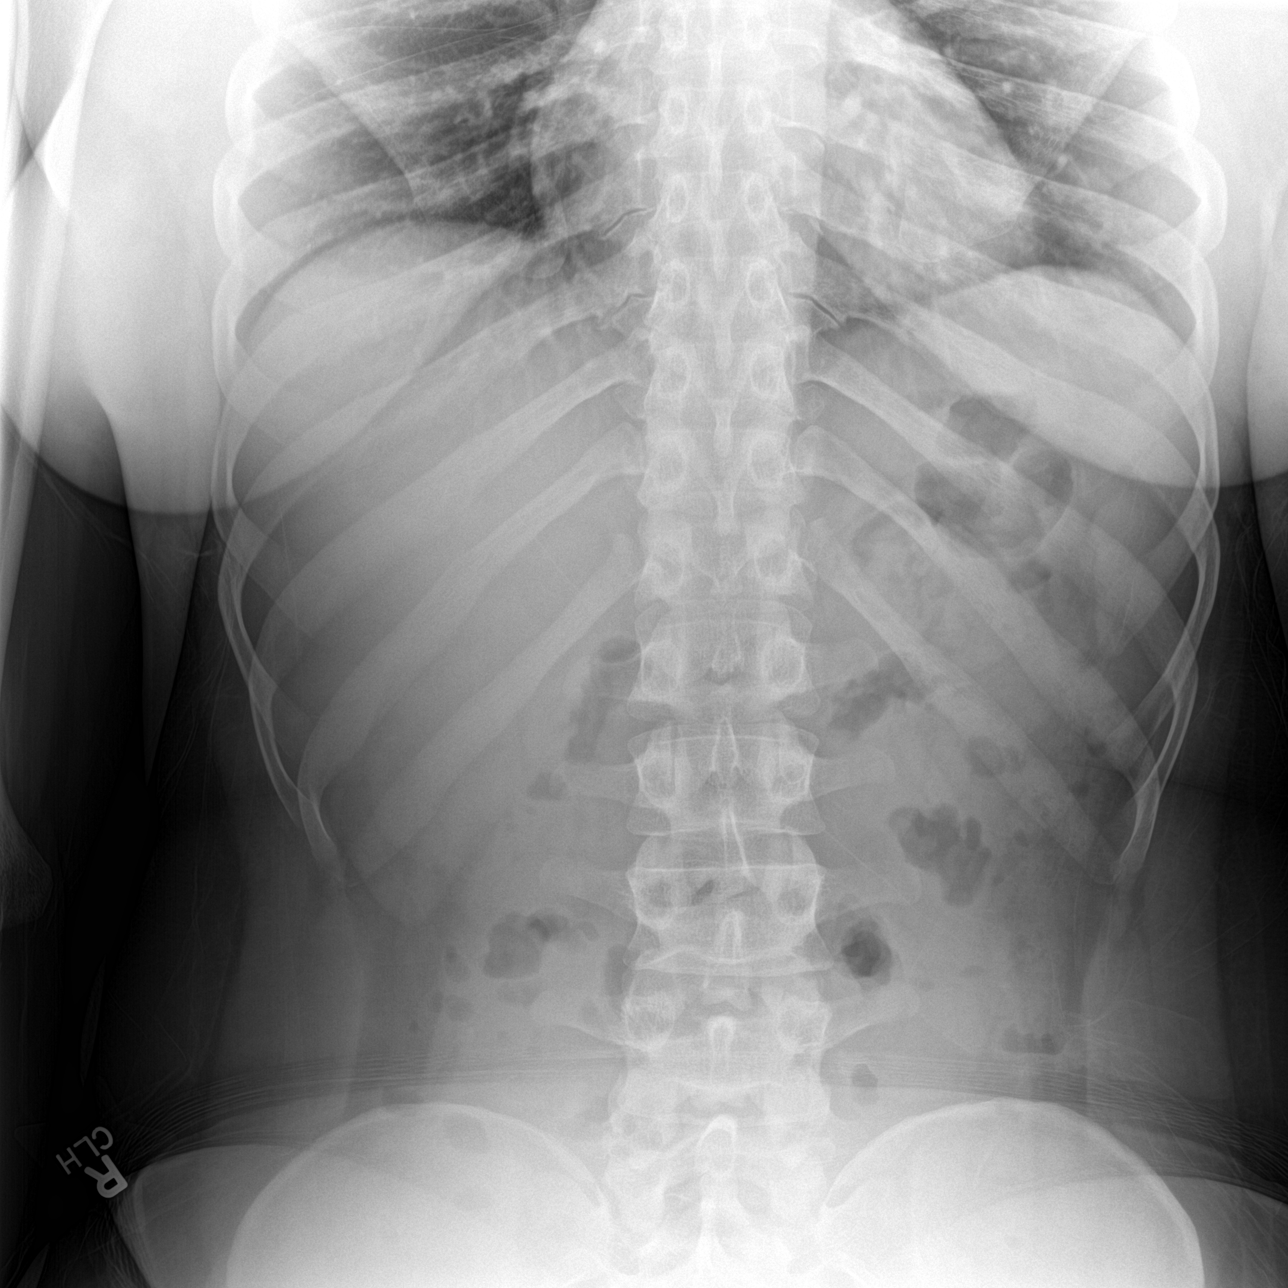

[abdomen supine]
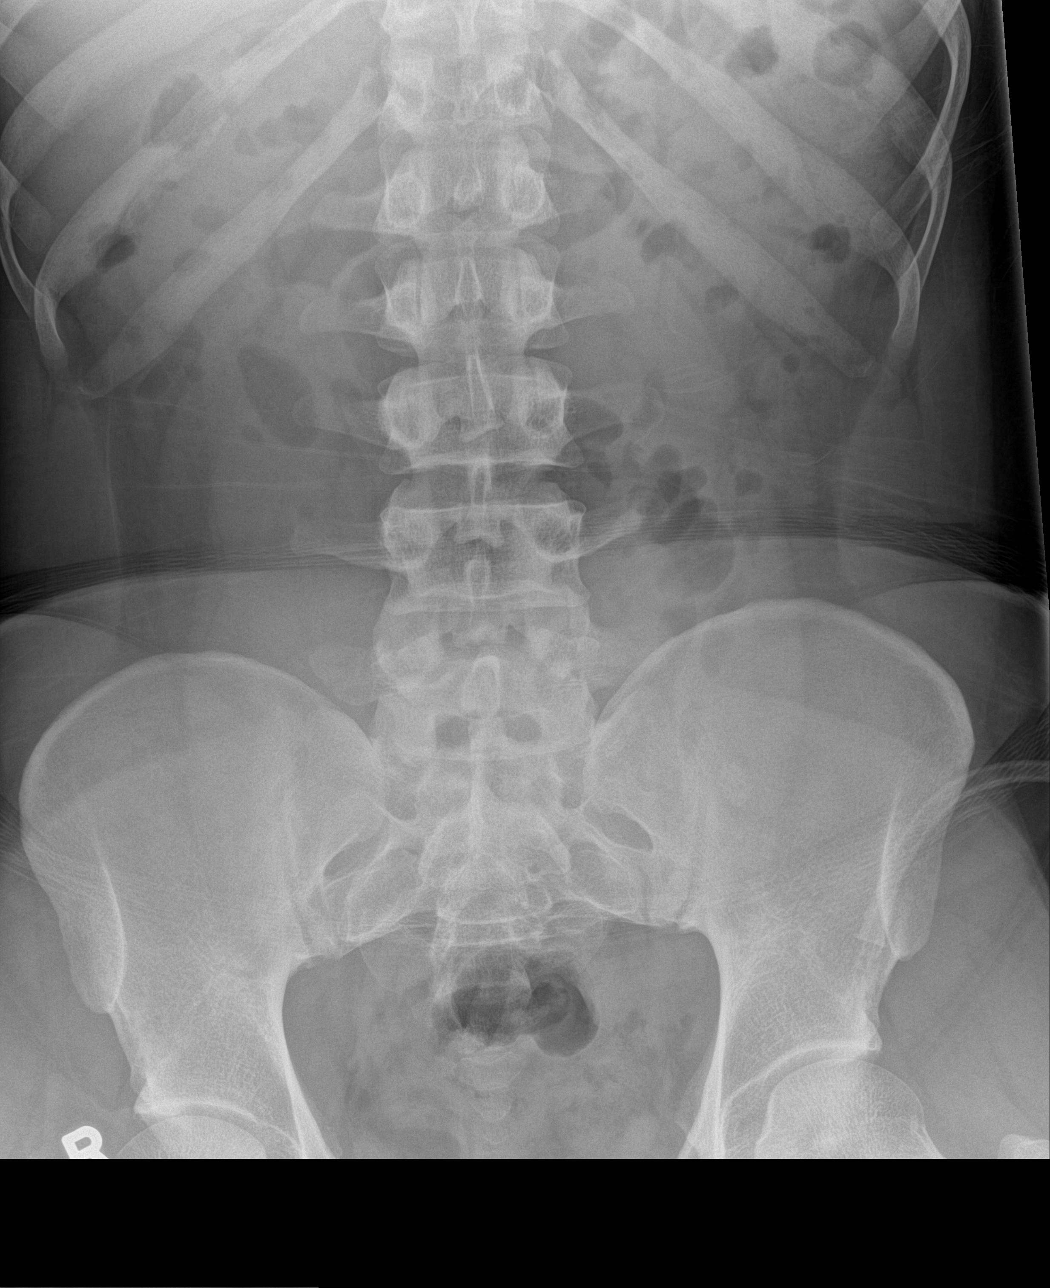

[3 of 3 positions shown; findings below may reference images not displayed]

FINDINGS: Lung volumes are normal. No consolidative airspace disease. No
pleural effusions. No pneumothorax. No pulmonary nodule or mass
noted. Pulmonary vasculature and the cardiomediastinal silhouette
are within normal limits.

Gas and stool are seen scattered throughout the colon extending to
the level of the distal rectum. No pathologic distension of small
bowel is noted. No gross evidence of pneumoperitoneum.
IMPRESSION: 1.  Nonobstructive bowel gas pattern.
2. No pneumoperitoneum.
3. No radiographic evidence of acute cardiopulmonary disease.

## 2017-09-05 ENCOUNTER — Ambulatory Visit (HOSPITAL_COMMUNITY)
Admission: EM | Admit: 2017-09-05 | Discharge: 2017-09-05 | Disposition: A | Discharge status: Home / Self Care | Payer: PRIVATE HEALTH INSURANCE | Attending: Family Medicine | Admitting: Family Medicine

## 2017-09-05 ENCOUNTER — Encounter (HOSPITAL_COMMUNITY): Payer: Self-pay | Admitting: Family Medicine

## 2017-09-05 DIAGNOSIS — J0110 Acute frontal sinusitis, unspecified: Secondary | ICD-10-CM

## 2017-09-05 MED ORDER — AMOXICILLIN 875 MG PO TABS: 875.0000 mg | ORAL_TABLET | Freq: Two times a day (BID) | ORAL | 0 refills | Status: AC

## 2017-09-05 NOTE — ED Triage Notes (Signed)
Pt here for sinus congestion, mucous, headache x 2 weeks. First began with cough,.

## 2017-09-05 NOTE — ED Provider Notes (Signed)
  [facility]   [id number] 09/05/17 Arrival Time: 1311  ASSESSMENT & PLAN:  1. Acute non-recurrent frontal sinusitis    Meds ordered this encounter  Medications  . amoxicillin (AMOXIL) 875 MG tablet    Sig: Take 1 tablet (875 mg total) by mouth 2 (two) times daily.    Dispense:  20 tablet    Refill:  0   OTC analgesics and symptom care as needed. May f/u as needed.  Reviewed expectations re: course of current medical issues. Questions answered. Outlined signs and symptoms indicating need for more acute intervention. Patient verbalized understanding. After Visit Summary given.   SUBJECTIVE:  Stacie Burns is a 31 y.o. female who presents with complaint of nasal congestion, post-nasal drainage, and sinus pressure for the past two weeks. Gradual onset. Afebrile. No respiratory symptoms. Does suffer from seasonal allergies. No n/v. Normal appetite. No OTC treatment. Mild frontal headache.  ROS: As per HPI.   OBJECTIVE:  Vitals:   09/05/17 1352  BP: (!) 144/97  Pulse: 66  Resp: 18  Temp: 98.7 F (37.1 C)  SpO2: 100%    General appearance: alert; no distress HEENT: nasal congestion; clear runny nose; throat irritation secondary to post-nasal drainage; frontal sinus tenderness Neck: supple without LAD Lungs: clear to auscultation bilaterally Skin: warm and dry Psychological: alert and cooperative; normal mood and affect  No Known Allergies   Social History   Social History  . Marital status: Single    Spouse name: N/A  . Number of children: N/A  . Years of education: N/A   Occupational History  . Not on file.   Social History Main Topics  . Smoking status: Former Smoker    Packs/day: 1.00    Quit date: 02/15/2013  . Smokeless tobacco: Never Used  . Alcohol use Yes     Comment: ocassionally, socially  . Drug use: No  . Sexual activity: Not on file   Other Topics Concern  . Not on file   Social History Narrative  . No narrative on file            Mardella Layman, MD 09/05/17 1517

## 2017-09-16 ENCOUNTER — Encounter (HOSPITAL_COMMUNITY): Payer: Self-pay | Admitting: Emergency Medicine

## 2017-09-16 ENCOUNTER — Ambulatory Visit (HOSPITAL_COMMUNITY)
Admission: EM | Admit: 2017-09-16 | Discharge: 2017-09-16 | Disposition: A | Discharge status: Home / Self Care | Payer: PRIVATE HEALTH INSURANCE | Attending: Emergency Medicine | Admitting: Emergency Medicine

## 2017-09-16 DIAGNOSIS — J301 Allergic rhinitis due to pollen: Secondary | ICD-10-CM | POA: Diagnosis not present

## 2017-09-16 DIAGNOSIS — R0982 Postnasal drip: Secondary | ICD-10-CM

## 2017-09-16 DIAGNOSIS — R0981 Nasal congestion: Secondary | ICD-10-CM

## 2017-09-16 DIAGNOSIS — J3489 Other specified disorders of nose and nasal sinuses: Secondary | ICD-10-CM

## 2017-09-16 MED ORDER — IPRATROPIUM BROMIDE 0.06 % NA SOLN: 2.0000 | Freq: Four times a day (QID) | NASAL | 1 refills | Status: DC

## 2017-09-16 NOTE — ED Triage Notes (Signed)
Pt here for persistent cold sx onset 3 weeks  Seen here on 09/05/17 for similar sx  Finished antibiotics given here  Sx today include: nasal drainage/congestion, cough  Denies fevers, chills  A&O x4... NAD... Ambulatory

## 2017-09-16 NOTE — ED Provider Notes (Addendum)
[facility]    CSN: [id number] Arrival date & time: 09/16/17  1321     History   Chief Complaint Chief Complaint  Patient presents with  . URI    HPI Stacie Burns is a 31 y.o. female.   31 year old female was seen about 3 weeks in the urgent care with upper respiratory congestion symptoms and treated for sinus infection with an antibiotic. Patient returns with similar symptoms. She has no fever or chills. Her chief complaint is that of runny nose, stuffy nose and PND. Denies shortness of breath. She states she does not really feel that bad is just the head symptoms that she is having.      History reviewed. No pertinent past medical history.  There are no active problems to display for this patient.   Past Surgical History:  Procedure Laterality Date  . transgender      OB History    No data available       Home Medications    Prior to Admission medications   Medication Sig Start Date End Date Taking? Authorizing Provider  erythromycin ophthalmic ointment Place a 1/2 inch ribbon of ointment into the lower eyelid. 08/19/15   Trixie Dredge, PA-C  estradiol (ESTRACE) 2 MG tablet Take 2 mg by mouth 2 (two) times daily.    [provider]  ipratropium (ATROVENT) 0.06 % nasal spray Place 2 sprays into both nostrils 4 (four) times daily. When necessary runny nose and sniffles. 09/16/17   Hayden Rasmussen, NP    Family History History reviewed. No pertinent family history.  Social History Social History  Substance Use Topics  . Smoking status: Former Smoker    Packs/day: 1.00    Quit date: 02/15/2013  . Smokeless tobacco: Never Used  . Alcohol use Yes     Comment: ocassionally, socially     Allergies   Patient has no known allergies.   Review of Systems Review of Systems  Constitutional: Negative for activity change, appetite change, chills, fatigue and fever.  HENT: Positive for congestion, postnasal drip, rhinorrhea and voice change. Negative  for facial swelling.   Eyes: Negative.   Respiratory: Positive for cough.   Cardiovascular: Negative.   Musculoskeletal: Negative for neck pain and neck stiffness.  Skin: Negative for pallor and rash.  Neurological: Negative.   All other systems reviewed and are negative.    Physical Exam Triage Vital Signs ED Triage Vitals [09/16/17 1355]  Enc Vitals Group     BP 127/76     Pulse Rate 77     Resp 20     Temp 98.7 F (37.1 C)     Temp Source Oral     SpO2 100 %     Weight      Height      Head Circumference      Peak Flow      Pain Score      Pain Loc      Pain Edu?      Excl. in GC?    No data found.   Updated Vital Signs BP 127/76 (BP Location: Left Arm)   Pulse 77   Temp 98.7 F (37.1 C) (Oral)   Resp 20   SpO2 100%   Visual Acuity Right Eye Distance:   Left Eye Distance:   Bilateral Distance:    Right Eye Near:   Left Eye Near:    Bilateral Near:     Physical Exam  Constitutional: She is oriented to  person, place, and time. She appears well-developed and well-nourished. No distress.  HENT:  Mouth/Throat: No oropharyngeal exudate.  Bilateral TMs are normal. Oropharynx with mild erythema. No exudates. Positive for thick clear mucoid PND.  Eyes: EOM are normal.  Neck: Normal range of motion. Neck supple.  Cardiovascular: Normal rate, regular rhythm and normal heart sounds.   Pulmonary/Chest: Effort normal and breath sounds normal. No respiratory distress. She has no wheezes.  Musculoskeletal: Normal range of motion. She exhibits no edema.  Lymphadenopathy:    She has no cervical adenopathy.  Neurological: She is alert and oriented to person, place, and time.  Skin: Skin is warm and dry. No rash noted.  Psychiatric: She has a normal mood and affect.  Nursing note and vitals reviewed.    UC Treatments / Results  Labs (all labs ordered are listed, but only abnormal results are displayed) Labs Reviewed - No data to display  EKG  EKG  Interpretation None       Radiology No results found.  Procedures Procedures (including critical care time)  Medications Ordered in UC Medications - No data to display   Initial Impression / Assessment and Plan / UC Course  I have reviewed the triage vital signs and the nursing notes.  Pertinent labs & imaging results that were available during my care of the patient were reviewed by me and considered in my medical decision making (see chart for details).   Use the Atrovent as directed. This should help with runny nose and sniffles. Drink plenty of fluids especially before going to bed and upon awakening. He may also use a copious amount of saline nasal spray if needed. The following medications can be used to help with her symptoms. Sudafed PE 10 mg every 4 to 6 hours as needed for congestion Allegra or Zyrtec daily as needed for drainage and runny nose. For stronger antihistamine may take Chlor-Trimeton 2 to 4 mg every 4 to 6 hours, may cause drowsiness.S Saline nasal spray used frequently. Drink plenty of fluids and stay well-hydrated. Flonase or Rhinocort nasal spray daily    Final Clinical Impressions(s) / UC Diagnoses   Final diagnoses:  Allergic rhinitis due to pollen, unspecified seasonality  Nasal congestion  Rhinorrhea  PND (post-nasal drip)    New Prescriptions New Prescriptions   IPRATROPIUM (ATROVENT) 0.06 % NASAL SPRAY    Place 2 sprays into both nostrils 4 (four) times daily. When necessary runny nose and sniffles.     Controlled Substance Prescriptions [place] Controlled Substance Registry consulted? Not Applicable   Hayden Rasmussen, NP 09/16/17 1534    Hayden Rasmussen, NP 09/16/17 1537

## 2017-09-16 NOTE — Discharge Instructions (Addendum)
Use the Atrovent as directed. This should help with runny nose and sniffles. Drink plenty of fluids especially before going to bed and upon awakening. He may also use a copious amount of saline nasal spray if needed. The following medications can be used to help with her symptoms. Sudafed PE 10 mg every 4 to 6 hours as needed for congestion Allegra or Zyrtec daily as needed for drainage and runny nose. For stronger antihistamine may take Chlor-Trimeton 2 to 4 mg every 4 to 6 hours, may cause drowsiness.S Saline nasal spray used frequently. Drink plenty of fluids and stay well-hydrated. Flonase or Rhinocort nasal spray daily

## 2018-03-08 ENCOUNTER — Encounter (HOSPITAL_COMMUNITY): Payer: Self-pay | Admitting: Emergency Medicine

## 2018-03-08 ENCOUNTER — Ambulatory Visit (HOSPITAL_COMMUNITY)
Admission: EM | Admit: 2018-03-08 | Discharge: 2018-03-08 | Disposition: A | Discharge status: Home / Self Care | Payer: Medicaid - Out of State | Attending: Family Medicine | Admitting: Family Medicine

## 2018-03-08 DIAGNOSIS — Z87891 Personal history of nicotine dependence: Secondary | ICD-10-CM | POA: Diagnosis not present

## 2018-03-08 DIAGNOSIS — J039 Acute tonsillitis, unspecified: Secondary | ICD-10-CM

## 2018-03-08 DIAGNOSIS — J029 Acute pharyngitis, unspecified: Secondary | ICD-10-CM | POA: Diagnosis present

## 2018-03-08 LAB — POCT RAPID STREP A: Streptococcus, Group A Screen (Direct): NEGATIVE

## 2018-03-08 MED ORDER — AMOXICILLIN-POT CLAVULANATE 875-125 MG PO TABS: 1.0000 | ORAL_TABLET | Freq: Two times a day (BID) | ORAL | 0 refills | Status: DC

## 2018-03-08 NOTE — ED Triage Notes (Signed)
Pt c/o sore throat since yesterday, pt took antibiotics that were leftover from another prescription, amoxicillin, states her throat feels better now.

## 2018-03-08 NOTE — ED Provider Notes (Signed)
[facility]    CSN: [id number] Arrival date & time: 03/08/18  1254     History   Chief Complaint Chief Complaint  Patient presents with  . Sore Throat    HPI Stacie Burns is a 32 y.o. female.   32 year old female, with no significant past medical history, presenting today complaining of 2 days of sore throat.  Patient states that she took 2 doses of amoxicillin that she had leftover last night.  States that she did feel better this morning.  She denies any fever or chills.  The history is provided by the patient.  Sore Throat  This is a new problem. The current episode started yesterday. The problem occurs constantly. The problem has not changed since onset.Pertinent negatives include no chest pain, no abdominal pain, no headaches and no shortness of breath. Nothing aggravates the symptoms. Relieved by: amoxicillin. Treatments tried: amoxicillin. The treatment provided mild relief.    History reviewed. No pertinent past medical history.  There are no active problems to display for this patient.   Past Surgical History:  Procedure Laterality Date  . transgender      OB History   None      Home Medications    Prior to Admission medications   Medication Sig Start Date End Date Taking? Authorizing Provider  amoxicillin-clavulanate (AUGMENTIN) 875-125 MG tablet Take 1 tablet by mouth every 12 (twelve) hours. 03/08/18   Blue, Olivia C, PA-C  erythromycin ophthalmic ointment Place a 1/2 inch ribbon of ointment into the lower eyelid. 08/19/15   Trixie DredgeWest, Emily, PA-C  estradiol (ESTRACE) 2 MG tablet Take 2 mg by mouth 2 (two) times daily.    [provider]  ipratropium (ATROVENT) 0.06 % nasal spray Place 2 sprays into both nostrils 4 (four) times daily. When necessary runny nose and sniffles. Patient not taking: Reported on 03/08/2018 09/16/17   Hayden RasmussenMabe, David, NP    Family History No family history on file.  Social History Social History   Tobacco Use  .  Smoking status: Former Smoker    Packs/day: 1.00    Last attempt to quit: 02/15/2013    Years since quitting: 5.0  . Smokeless tobacco: Never Used  Substance Use Topics  . Alcohol use: Yes    Comment: ocassionally, socially  . Drug use: No     Allergies   Patient has no known allergies.   Review of Systems Review of Systems  Constitutional: Negative for chills and fever.  HENT: Positive for sore throat. Negative for ear pain.   Eyes: Negative for pain and visual disturbance.  Respiratory: Negative for cough and shortness of breath.   Cardiovascular: Negative for chest pain and palpitations.  Gastrointestinal: Negative for abdominal pain and vomiting.  Genitourinary: Negative for dysuria and hematuria.  Musculoskeletal: Negative for arthralgias and back pain.  Skin: Negative for color change and rash.  Neurological: Negative for seizures, syncope and headaches.  All other systems reviewed and are negative.    Physical Exam Triage Vital Signs ED Triage Vitals  Enc Vitals Group     BP 03/08/18 1303 (!) 144/81     Pulse Rate 03/08/18 1303 83     Resp 03/08/18 1303 18     Temp 03/08/18 1303 98.8 F (37.1 C)     Temp src --      SpO2 03/08/18 1303 100 %     Weight --      Height --      Head Circumference --  Peak Flow --      Pain Score 03/08/18 1304 5     Pain Loc --      Pain Edu? --      Excl. in GC? --    No data found.  Updated Vital Signs BP (!) 144/81   Pulse 83   Temp 98.8 F (37.1 C)   Resp 18   SpO2 100%   Visual Acuity Right Eye Distance:   Left Eye Distance:   Bilateral Distance:    Right Eye Near:   Left Eye Near:    Bilateral Near:     Physical Exam  Constitutional: She appears well-developed and well-nourished. No distress.  HENT:  Head: Normocephalic and atraumatic.  Right Ear: Hearing, tympanic membrane, external ear and ear canal normal.  Left Ear: Hearing, tympanic membrane, external ear and ear canal normal.  Nose: Nose  normal.  Mouth/Throat: Oropharyngeal exudate, posterior oropharyngeal edema and posterior oropharyngeal erythema present. No tonsillar abscesses.  Eyes: Conjunctivae are normal.  Neck: Neck supple.  Cardiovascular: Normal rate and regular rhythm.  No murmur heard. Pulmonary/Chest: Effort normal and breath sounds normal. No stridor. No respiratory distress. She has no wheezes. She has no rhonchi. She has no rales.  Abdominal: Soft. There is no tenderness.  Musculoskeletal: She exhibits no edema.  Neurological: She is alert.  Skin: Skin is warm and dry.  Psychiatric: She has a normal mood and affect.  Nursing note and vitals reviewed.    UC Treatments / Results  Labs (all labs ordered are listed, but only abnormal results are displayed) Labs Reviewed  CULTURE, GROUP A STREP [facility])  POCT RAPID STREP A    EKG None Radiology No results found.  Procedures Procedures (including critical care time)  Medications Ordered in UC Medications - No data to display   Initial Impression / Assessment and Plan / UC Course  I have reviewed the triage vital signs and the nursing notes.  Pertinent labs & imaging results that were available during my care of the patient were reviewed by me and considered in my medical decision making (see chart for details).     Negative strep.  Will treat for tonsillitis Given ENT referral per patient request  Final Clinical Impressions(s) / UC Diagnoses   Final diagnoses:  Tonsillitis    ED Discharge Orders        Ordered    amoxicillin-clavulanate (AUGMENTIN) 875-125 MG tablet  Every 12 hours     03/08/18 1321       Controlled Substance Prescriptions [place] Controlled Substance Registry consulted? Not Applicable   Alecia Lemming, [place] 03/08/18 1331

## 2018-03-11 LAB — CULTURE, GROUP A STREP (THRC)

## 2018-03-19 ENCOUNTER — Telehealth (HOSPITAL_COMMUNITY): Payer: Self-pay

## 2018-03-19 NOTE — Telephone Encounter (Signed)
Pt contacted regarding results from recent visit. Pt reports feeling better and is appreciative.

## 2018-06-18 LAB — HEPATIC FUNCTION PANEL
ALT: 6
AST: 9
Alkaline Phosphatase: 51 (ref 25–125)
BILIRUBIN DIRECT: 0.1 (ref 0.01–0.4)
Bilirubin, Total: 0.4

## 2018-06-20 ENCOUNTER — Encounter (HOSPITAL_COMMUNITY): Payer: Self-pay | Admitting: Emergency Medicine

## 2018-06-20 ENCOUNTER — Ambulatory Visit (HOSPITAL_COMMUNITY)
Admission: EM | Admit: 2018-06-20 | Discharge: 2018-06-20 | Disposition: A | Discharge status: Home / Self Care | Payer: Medicaid - Out of State | Attending: Family Medicine | Admitting: Family Medicine

## 2018-06-20 ENCOUNTER — Other Ambulatory Visit: Payer: Self-pay

## 2018-06-20 DIAGNOSIS — Z5189 Encounter for other specified aftercare: Secondary | ICD-10-CM

## 2018-06-20 DIAGNOSIS — S1190XA Unspecified open wound of unspecified part of neck, initial encounter: Secondary | ICD-10-CM

## 2018-06-20 DIAGNOSIS — Z4802 Encounter for removal of sutures: Secondary | ICD-10-CM

## 2018-06-20 HISTORY — DX: Other specified health status: Z78.9

## 2018-06-20 HISTORY — DX: Transsexualism: F64.0

## 2018-06-20 NOTE — ED Triage Notes (Addendum)
Sutures to anterior neck.  Has paperwork from 06/12/18. Patient is transgender and had surgery on "voice".  Patient had procedure done in [place] by dr Hilton Sinclairhaben.  Patient unable to speak at this point

## 2018-06-20 NOTE — Discharge Instructions (Signed)
Suture removed without difficulty. F/U with surgeon's instruction. Call if you have any question.

## 2018-06-20 NOTE — ED Provider Notes (Signed)
[facility]    CSN: [id number] Arrival date & time: 06/20/18  1001     History   Chief Complaint Chief Complaint  Patient presents with  . Wound Check  . Suture / Staple Removal    HPI Stacie Burns is a 32 y.o. female.   HPI Wound check: She had voice change surgery [place] 8 [place] ago [place]. She denies pain, no fever,no discharge or leakage from the area. She is currently on voice rest for 30 days. No difficulty with feeding.  Past Medical History:  Diagnosis Date  . Transgender     There are no active problems to display for this patient.   Past Surgical History:  Procedure Laterality Date  . THROAT SURGERY    . transgender      OB History   None      Home Medications    Prior to Admission medications   Medication Sig Start Date End Date Taking? Authorizing Provider  NON FORMULARY Patient is taking an antibiotic   Yes [provider]  amoxicillin-clavulanate (AUGMENTIN) 875-125 MG tablet Take 1 tablet by mouth every 12 (twelve) hours. Patient not taking: Reported on 06/20/2018 03/08/18   Alysia PennaBlue, Olivia C, PA-C  erythromycin ophthalmic ointment Place a 1/2 inch ribbon of ointment into the lower eyelid. Patient not taking: Reported on 06/20/2018 08/19/15   Trixie DredgeWest, Emily, PA-C  estradiol (ESTRACE) 2 MG tablet Take 2 mg by mouth 2 (two) times daily.    [provider]  ipratropium (ATROVENT) 0.06 % nasal spray Place 2 sprays into both nostrils 4 (four) times daily. When necessary runny nose and sniffles. Patient not taking: Reported on 03/08/2018 09/16/17   Hayden RasmussenMabe, David, NP    Family History Family History  Problem Relation Age of Onset  . Healthy Mother   . Healthy Father     Social History Social History   Tobacco Use  . Smoking status: Former Smoker    Packs/day: 1.00    Last attempt to quit: 02/15/2013    Years since quitting: 5.3  . Smokeless tobacco: Never Used  Substance Use Topics  . Alcohol use: Yes    Comment:  ocassionally, socially  . Drug use: No     Allergies   Patient has no known allergies.   Review of Systems Review of Systems  Respiratory: Negative.   Cardiovascular: Negative.   Skin:       Wound on her neck, need suture removal  All other systems reviewed and are negative.    Physical Exam Triage Vital Signs ED Triage Vitals  Enc Vitals Group     BP 06/20/18 1026 124/84     Pulse Rate 06/20/18 1026 62     Resp 06/20/18 1026 18     Temp 06/20/18 1026 98.9 F (37.2 C)     Temp Source 06/20/18 1026 Temporal     SpO2 06/20/18 1026 100 %     Weight --      Height --      Head Circumference --      Peak Flow --      Pain Score 06/20/18 1021 0     Pain Loc --      Pain Edu? --      Excl. in GC? --    No data found.  Updated Vital Signs BP 124/84 (BP Location: Left Arm) Comment (BP Location): large cuff  Pulse 62   Temp 98.9 F (37.2 C) (Temporal)   Resp 18  SpO2 100%   Visual Acuity Right Eye Distance:   Left Eye Distance:   Bilateral Distance:    Right Eye Near:   Left Eye Near:    Bilateral Near:     Physical Exam  Constitutional: She appears well-developed. No distress.  Cardiovascular: Normal rate, regular rhythm and normal heart sounds.  No murmur heard. Pulmonary/Chest: Effort normal and breath sounds normal. No stridor. No respiratory distress. She has no wheezes.  Abdominal: Soft. Bowel sounds are normal. She exhibits no distension and no mass. There is no tenderness.  Skin:        UC Treatments / Results  Labs (all labs ordered are listed, but only abnormal results are displayed) Labs Reviewed - No data to display  EKG None  Radiology No results found.  Procedures Procedures (including critical care time)  Medications Ordered in UC Medications - No data to display  Initial Impression / Assessment and Plan / UC Course  I have reviewed the triage vital signs and the nursing notes.  Pertinent labs & imaging results that were  available during my care of the patient were reviewed by me and considered in my medical decision making (see chart for details).  Clinical Course as of Jun 20 1099  Thu Jun 20, 2018  1058 I called and spoke with Dr. Murrell Converse who was online throughout the procedure to ensure it went well. Suture removal completed after obtaining verbal consent. No immediate complication or wound dehiscence. Wound cleaned with alcohol swab. Keep wound open per surgeon's instructed. F/U as instructed.   [KE]    Clinical Course User Index [KE] Doreene Eland, MD    Visit for wound check  Visit for suture removal   Final Clinical Impressions(s) / UC Diagnoses   Final diagnoses:  None   Discharge Instructions   None    ED Prescriptions    None     Controlled Substance Prescriptions [place] Controlled Substance Registry consulted? Not Applicable   Doreene Eland, MD 06/20/18 1100

## 2018-09-28 ENCOUNTER — Encounter (HOSPITAL_COMMUNITY): Payer: Self-pay | Admitting: Emergency Medicine

## 2018-09-28 ENCOUNTER — Ambulatory Visit (HOSPITAL_COMMUNITY)
Admission: EM | Admit: 2018-09-28 | Discharge: 2018-09-28 | Disposition: A | Discharge status: Home / Self Care | Payer: Self-pay | Attending: Physician Assistant | Admitting: Physician Assistant

## 2018-09-28 DIAGNOSIS — L0292 Furuncle, unspecified: Secondary | ICD-10-CM

## 2018-09-28 MED ORDER — DOXYCYCLINE HYCLATE 100 MG PO CAPS: 100.0000 mg | ORAL_CAPSULE | Freq: Two times a day (BID) | ORAL | 0 refills | Status: AC

## 2018-09-28 NOTE — Discharge Instructions (Signed)
Start meds today.

## 2018-09-28 NOTE — ED Triage Notes (Signed)
Pt states she noticed a bump on her scalp, pt states it feels like a cyst. Denies itchiness or pain, only when lying down on the right side of her head.

## 2018-09-28 NOTE — ED Provider Notes (Signed)
09/28/2018 11:02 AM   DOB: [birth year] / MRN: [id number]  SUBJECTIVE:  Stacie Burns is a 32 y.o. female presenting for scalp infection that started 3-4 ago.  Assoicates pain about the area.  Denies fever.  Has tried nothing.   She has No Known Allergies.   She  has a past medical history of Transgender.    She  reports that she quit smoking about 5 years ago. She smoked 1.00 pack per day. She has never used smokeless tobacco. She reports that she drinks alcohol. She reports that she does not use drugs. She  has no sexual activity history on file. The patient  has a past surgical history that includes transgender and Throat surgery.  Her family history includes Healthy in her father and mother.  ROS  Per HPI OBJECTIVE:  BP (!) 144/91   Pulse 92   Temp 98 F (36.7 C)   Resp 18   SpO2 100%   Wt Readings from Last 3 Encounters:  08/19/15 215 lb (97.5 kg)  08/25/14 230 lb (104.3 kg)  08/09/14 225 lb (102.1 kg)   Temp Readings from Last 3 Encounters:  09/28/18 98 F (36.7 C)  06/20/18 98.9 F (37.2 C) (Temporal)  03/08/18 98.8 F (37.1 C)   BP Readings from Last 3 Encounters:  09/28/18 (!) 144/91  06/20/18 124/84  03/08/18 (!) 144/81   Pulse Readings from Last 3 Encounters:  09/28/18 92  06/20/18 62  03/08/18 83    Physical Exam  Constitutional: She is oriented to person, place, and time. She appears well-nourished. No distress.  HENT:  Head:    Eyes: Pupils are equal, round, and reactive to light. EOM are normal.  Cardiovascular: Normal rate.  Pulmonary/Chest: Effort normal.  Abdominal: She exhibits no distension.  Neurological: She is alert and oriented to person, place, and time. No cranial nerve deficit. Gait normal.  Skin: Skin is dry. She is not diaphoretic.  Psychiatric: She has a normal mood and affect.  Vitals reviewed.   No results found for this or any previous visit (from the past 72 hour(s)).  No results found.  ASSESSMENT AND  PLAN:   Furuncle    Discharge Instructions     Start meds today.         The patient is advised to call or return to clinic if she does not see an improvement in symptoms, or to seek the care of the closest emergency department if she worsens with the above plan.   Deliah Boston, MHS, PA-C 09/28/2018 11:02 AM   Ofilia Neas, PA-C 09/28/18 1102

## 2018-10-23 ENCOUNTER — Encounter: Payer: Self-pay | Admitting: Physician Assistant

## 2018-10-23 ENCOUNTER — Ambulatory Visit (INDEPENDENT_AMBULATORY_CARE_PROVIDER_SITE_OTHER): Payer: Self-pay | Admitting: Physician Assistant

## 2018-10-23 VITALS — BP 130/84 | HR 69 | Resp 14 | Ht 69.0 in | Wt 234.0 lb

## 2018-10-23 DIAGNOSIS — Z7689 Persons encountering health services in other specified circumstances: Secondary | ICD-10-CM

## 2018-10-23 DIAGNOSIS — E349 Endocrine disorder, unspecified: Secondary | ICD-10-CM

## 2018-10-23 MED ORDER — NEEDLE (DISP) 25G X 5/8" MISC
1 refills | Status: DC
Start: 2018-10-23 — End: 2019-05-29

## 2018-10-23 MED ORDER — SYRINGE (DISPOSABLE) 1 ML MISC: 0 refills | Status: DC

## 2018-10-23 MED ORDER — ESTRADIOL VALERATE 40 MG/ML IM OIL: 10.0000 mg | TOPICAL_OIL | INTRAMUSCULAR | 0 refills | Status: DC

## 2018-10-23 MED ORDER — MEDROXYPROGESTERONE ACETATE 2.5 MG PO TABS: 2.5000 mg | ORAL_TABLET | Freq: Every day | ORAL | 0 refills | Status: DC

## 2018-10-23 MED ORDER — "NEEDLE (DISP) 18G X 1"" MISC": 0 refills | Status: DC

## 2018-10-23 MED ORDER — BICALUTAMIDE 50 MG PO TABS: 50.0000 mg | ORAL_TABLET | Freq: Every day | ORAL | 0 refills | Status: DC

## 2018-10-23 NOTE — Patient Instructions (Signed)
For subcutaneous injection 1) Use the abdomen or hip/upper lateral buttock region. I find if people use the thigh, there is a greater likelihood of hematoma formation.  2) No pinching with the injection, that is reserved for IM injecting 3) hold pressure on site after the needle is withdrawn.  4) I use a 25g 5/8in needle to inject and an 18g to drawn from the vial and a 1ml syringe.   Bicalutamide tablets What is this medicine? BICALUTAMIDE (bye ka LOO ta mide) blocks the effect of the female hormone testosterone on the prostate. This medicine is used to treat advanced prostate cancer in men. It is given with other treatments. This medicine may be used for other purposes; ask your health care provider or pharmacist if you have questions. COMMON BRAND NAME(S): Casodex What should I tell my health care provider before I take this medicine? They need to know if you have any of these conditions: -have a partner that is pregnant or could become pregnant -if you are female (this medicine is not for use in women) -liver disease -an unusual or allergic reaction to bicalutamide, other medicines, foods, dyes, or preservatives How should I use this medicine? Take this medicine by mouth with a glass of water. You may take it with or without food. Follow the directions on the prescription label. Take your medicine at regular intervals. Do not take your medicine more often than directed. Do not stop taking except on your doctor's advice. Talk to your pediatrician regarding the use of this medicine in children. Special care may be needed. Overdosage: If you think you have taken too much of this medicine contact a poison control center or emergency room at once. NOTE: This medicine is only for you. Do not share this medicine with others. What if I miss a dose? If you miss a dose, take it as soon as you can. If it is almost time for your next dose, take only that dose. Do not take double or extra doses. What  may interact with this medicine? -warfarin This list may not describe all possible interactions. Give your health care provider a list of all the medicines, herbs, non-prescription drugs, or dietary supplements you use. Also tell them if you smoke, drink alcohol, or use illegal drugs. Some items may interact with your medicine. What should I watch for while using this medicine? Visit your doctor or health care professional for regular checks on your progress. You may need regular tests to make sure your liver is working properly. This medicine should not be used in women. Serious side effects to an unborn child are possible. Men should use effective birth control while taking this medicine and for 130 days after stopping it. Talk to your doctor if you have any questions. Contact your doctor right away if your female partner becomes pregnant. This medicine may interfere with the ability to have a child. Talk with your doctor or health care professional if you are concerned about your fertility. This medicine can make you more sensitive to the sun. Keep out of the sun. If you cannot avoid being in the sun, wear protective clothing and use sunscreen. Do not use sun lamps or tanning beds/booths. What side effects may I notice from receiving this medicine? Side effects that you should report to your doctor or health care professional as soon as possible: - allergic reactions like skin rash, itching or hives, swelling of the face, lips, or tongue - blood in the urine - breathing  problems - chest pain - dark urine - severe nausea and vomiting - yellowing of the eyes or skin Side effects that usually do not require medical attention (report to your doctor or health care professional if they continue or are bothersome): - diarrhea - hot flashes - loss of appetite - nausea -sensitivity to light - weak or tired This list may not describe all possible side effects. Call your doctor for  medical advice about side effects. You may report side effects to FDA at 1-800-FDA-1088. Where should I keep my medicine? Keep out of the reach of children. Store between 20 and 25 degrees C (68 and 77 degrees F). Throw away any unused medicine after the expiration date. NOTE: This sheet is a summary. It may not cover all possible information. If you have questions about this medicine, talk to your doctor, pharmacist, or health care provider.  2018 Elsevier/Gold Standard (2016-03-14 11:54:08)

## 2018-10-23 NOTE — Progress Notes (Signed)
HPI:                                                                Stacie Burns is a 32 y.o. adult who presents to [facility]: [facility] today to establish care  Current concerns: hormone therapy  Stacie Burns is a very pleasant transgender female, AMAB. She is transferring her care from Dr. Ruby Burns.   She reports she began transitioning around 31-34 years old. Her legal name aligns with her gender identity and she has changed her gender markers to female.  She completed gender dysphoria evaluation with Stacie Burns on 09/01/2014 and was under the care of Dr. Ruby Burns during that time up until his retirement earlier this year. Currently on estradiol valerate 10 mg weekly, but has been rationing this until she found a new prescriber.   Reports she was unable to tolerate Spironolactone (caused anxiety symptoms) and has not had any androgen blockade for the past year. She has noticed increased unwanted hair growth. She also has done some research on progesterone and is interested in adding this to her current hormone regimen to see if it has positive effects on mood and libido.   Depression screen PHQ 2/9 10/24/2018  Decreased Interest 0  Down, Depressed, Hopeless 0  PHQ - 2 Score 0    No flowsheet data found.    History reviewed. No pertinent past medical history. Past Surgical History:  Procedure Laterality Date  . THROAT SURGERY    . TRACHEAL SURGERY     Social History   Tobacco Use  . Smoking status: Former Smoker    Packs/day: 1.00    Years: 6.00    Pack years: 6.00    Last attempt to quit: 02/15/2013    Years since quitting: 5.6  . Smokeless tobacco: Never Used  Substance Use Topics  . Alcohol use: Yes    Comment: ocassionally, socially   family history includes Breast cancer in her other; Diabetes in her maternal grandmother; Ovarian cancer in her other.    ROS: negative except as noted in the HPI  Medications: Current  Outpatient Medications  Medication Sig Dispense Refill  . bicalutamide (CASODEX) 50 MG tablet Take 1 tablet (50 mg total) by mouth daily. 30 tablet 0  . estradiol (ESTRACE) 2 MG tablet Take 2 mg by mouth 2 (two) times daily.    Marland Kitchen estradiol valerate (DELESTROGEN) 40 MG/ML injection Inject 0.25 mLs (10 mg total) into the muscle every 7 (seven) days. 5 mL 0  . medroxyPROGESTERone (PROVERA) 2.5 MG tablet Take 1 tablet (2.5 mg total) by mouth at bedtime. 30 tablet 0  . NEEDLE, DISP, 18 G 18G X 1" MISC To draw estradiol valerate 4 each 0  . NEEDLE, DISP, 25 G (BD DISP NEEDLES) 25G X 5/8" MISC For subcutaneous use 10 each 1  . NON FORMULARY Patient is taking an antibiotic    . Syringe, Disposable, 1 ML MISC For weekly use with estradiol valerate 4 each 0   No current facility-administered medications for this visit.    No Known Allergies     Objective:  BP 130/84   Pulse 69   Resp 14   Ht 5\' 9"  (1.753 m)   Wt 234 lb (106.1 kg)  BMI 34.56 kg/m  Gen:  alert, not ill-appearing, no distress, appropriate for age, obese adult HEENT: head normocephalic without obvious abnormality, conjunctiva and cornea clear, trachea midline Pulm: Normal work of breathing, normal phonation, clear to auscultation bilaterally, no wheezes, rales or rhonchi CV: Normal rate, regular rhythm, s1 and s2 distinct, no murmurs, clicks or rubs  Neuro: alert and oriented x 3, no tremor MSK: extremities atraumatic, normal gait and station Skin: intact, no rashes on exposed skin, no jaundice, no cyanosis Psych: well-groomed, cooperative, good eye contact, euthymic mood, affect mood-congruent, speech is articulate, and thought processes clear and goal-directed    No results found for this or any previous visit (from the past 72 hour(s)). No results found.    Assessment and Plan: 32 y.o. adult with  .Diagnoses and all orders for this visit:  Encounter to establish care  Endocrine disorder, unspecified -      Discontinue: estradiol valerate (DELESTROGEN) 40 MG/ML injection; Inject 0.25 mLs (10 mg total) into the muscle every 7 (seven) weeks. -     medroxyPROGESTERone (PROVERA) 2.5 MG tablet; Take 1 tablet (2.5 mg total) by mouth at bedtime. -     NEEDLE, DISP, 18 G 18G X 1" MISC; To draw estradiol valerate -     Syringe, Disposable, 1 ML MISC; For weekly use with estradiol valerate -     NEEDLE, DISP, 25 G (BD DISP NEEDLES) 25G X 5/8" MISC; For subcutaneous use -     Comprehensive metabolic panel -     bicalutamide (CASODEX) 50 MG tablet; Take 1 tablet (50 mg total) by mouth daily. -     estradiol valerate (DELESTROGEN) 40 MG/ML injection; Inject 0.25 mLs (10 mg total) into the muscle every 7 (seven) days.    Patient meets diagnostic criteria for gender dysphoria according to [facility] Burns guidelines They completed formal evaluation with Stacie Burns in 2015 and a copy of this evaluation was provided today (sent to scan) Patient has the capacity to provide informed consent Written informed consent completed in office today (sent to scan) Risks and benefits of hormone therapy were reviewed with patient in detail and all questions were answered Personally reviewed liver function panel from 06/18/2018, no abnormalities Detailed discussion with patient regarding risk of liver injury with use of Bicalutamide and need for close laboratory monitoring with this medication. Patient understands risk Will check CMP in 1 month, then liver function Q60months  Patient education and anticipatory guidance given Patient agrees with treatment plan Follow-up as needed if symptoms worsen or fail to improve  Stacie Hubert PA-C

## 2018-10-24 ENCOUNTER — Encounter: Payer: Self-pay | Admitting: Physician Assistant

## 2018-10-24 DIAGNOSIS — E349 Endocrine disorder, unspecified: Secondary | ICD-10-CM | POA: Insufficient documentation

## 2018-10-24 MED ORDER — ESTRADIOL VALERATE 40 MG/ML IM OIL: 10.0000 mg | TOPICAL_OIL | INTRAMUSCULAR | 0 refills | Status: DC

## 2018-11-25 ENCOUNTER — Other Ambulatory Visit: Payer: Self-pay

## 2018-11-25 DIAGNOSIS — E349 Endocrine disorder, unspecified: Secondary | ICD-10-CM

## 2018-11-25 MED ORDER — BICALUTAMIDE 50 MG PO TABS: 50.0000 mg | ORAL_TABLET | Freq: Every day | ORAL | 0 refills | Status: DC

## 2018-11-25 MED ORDER — MEDROXYPROGESTERONE ACETATE 2.5 MG PO TABS: 2.5000 mg | ORAL_TABLET | Freq: Every day | ORAL | 0 refills | Status: DC

## 2019-05-29 ENCOUNTER — Encounter: Payer: Self-pay | Admitting: Physician Assistant

## 2019-05-29 ENCOUNTER — Ambulatory Visit (INDEPENDENT_AMBULATORY_CARE_PROVIDER_SITE_OTHER): Payer: Self-pay | Admitting: Physician Assistant

## 2019-05-29 VITALS — BP 121/82 | HR 90 | Temp 99.2°F | Wt 247.0 lb

## 2019-05-29 DIAGNOSIS — Z5181 Encounter for therapeutic drug level monitoring: Secondary | ICD-10-CM

## 2019-05-29 DIAGNOSIS — E349 Endocrine disorder, unspecified: Secondary | ICD-10-CM

## 2019-05-29 MED ORDER — BICALUTAMIDE 50 MG PO TABS: 50.0000 mg | ORAL_TABLET | Freq: Every day | ORAL | 0 refills | Status: DC

## 2019-05-29 MED ORDER — "BD DISP NEEDLES 25G X 5/8"" MISC": 1 refills | Status: DC

## 2019-05-29 MED ORDER — ESTRADIOL VALERATE 40 MG/ML IM OIL: 10.0000 mg | TOPICAL_OIL | INTRAMUSCULAR | 0 refills | Status: DC

## 2019-05-29 MED ORDER — "NEEDLE (DISP) 18G X 1"" MISC": 1 refills | Status: DC

## 2019-05-29 MED ORDER — SYRINGE (DISPOSABLE) 1 ML MISC: 1 refills | Status: DC

## 2019-05-29 NOTE — Progress Notes (Signed)
HPI:                                                                Stacie Burns is a 33 y.o. adult who presents to [facility]: [place] today for medication management  This is a pleasant 33 yo transgender female, [place]. Doing well. No concerns. Last estradiol injection approx 1.5 weeks ago. Has not taken Bicalutamide in several months. She was overdue for liver function monitoring.  Depression screen [facility] 2/9 05/29/2019 10/24/2018  Decreased Interest 0 0  Down, Depressed, Hopeless 0 0  PHQ - 2 Score 0 0    No flowsheet data found.    No past medical history on file. Past Surgical History:  Procedure Laterality Date  . THROAT SURGERY    . TRACHEAL SURGERY     Social History   Tobacco Use  . Smoking status: Former Smoker    Packs/day: 1.00    Years: 6.00    Pack years: 6.00    Quit date: 02/15/2013    Years since quitting: 6.2  . Smokeless tobacco: Never Used  Substance Use Topics  . Alcohol use: Yes    Comment: ocassionally, socially   family history includes Breast cancer in an other family member; Diabetes in her maternal grandmother; Ovarian cancer in an other family member.    ROS: negative except as noted in the HPI  Medications: Current Outpatient Medications  Medication Sig Dispense Refill  . bicalutamide (CASODEX) 50 MG tablet Take 1 tablet (50 mg total) by mouth daily. 30 tablet 0  . estradiol valerate (DELESTROGEN) 40 MG/ML injection Inject 0.25 mLs (10 mg total) into the muscle every 7 (seven) days. 5 mL 0  . NEEDLE, DISP, 18 G 18G X 1" MISC To draw estradiol valerate 12 each 1  . NEEDLE, DISP, 25 G (BD DISP NEEDLES) 25G X 5/8" MISC For subcutaneous use 12 each 1  . NON FORMULARY Patient is taking an antibiotic    . Syringe, Disposable, 1 ML MISC For weekly use with estradiol valerate 12 each 1   No current facility-administered medications for this visit.    No Known Allergies     Objective:  BP 121/82    Pulse 90   Temp 99.2 F (37.3 C) (Oral)   Wt 247 lb (112 kg)   BMI 36.48 kg/m  Gen:  alert, not ill-appearing, no distress, appropriate for age 34: head normocephalic without obvious abnormality, conjunctiva and cornea clear, trachea midline Pulm: Normal work of breathing, normal phonation, clear to auscultation bilaterally, no wheezes, rales or rhonchi CV: Normal rate, regular rhythm, s1 and s2 distinct, no murmurs, clicks or rubs  Neuro: alert and oriented x 3, no tremor MSK: extremities atraumatic, normal gait and station Skin: intact, no rashes on exposed skin, no jaundice, no cyanosis Psych: well-groomed, cooperative, good eye contact, euthymic mood, affect mood-congruent, speech is articulate, and thought processes clear and goal-directed  Wt Readings from Last 3 Encounters:  05/29/19 247 lb (112 kg)  10/23/18 234 lb (106.1 kg)  08/19/15 215 lb (97.5 kg)   Lab Results  Component Value Date   CREATININE 0.99 06/07/2015   BUN 12 06/07/2015   NA 137 06/07/2015   K 3.4 (L) 06/07/2015  CL 102 06/07/2015   CO2 25 06/07/2015   Lab Results  Component Value Date   ALT 6 06/18/2018   AST 9 06/18/2018   ALKPHOS 51 06/18/2018   BILITOT 0.5 06/07/2015     No results found for this or any previous visit (from the past 72 hour(s)). No results found.    Assessment and Plan: 33 y.o. adult with   .Stacie Burns was seen today for medication management.  Diagnoses and all orders for this visit:  Medication monitoring encounter -     COMPLETE METABOLIC PANEL WITH GFR  Endocrine disorder, unspecified -     estradiol valerate (DELESTROGEN) 40 MG/ML injection; Inject 0.25 mLs (10 mg total) into the muscle every 7 (seven) days. -     COMPLETE METABOLIC PANEL WITH GFR -     bicalutamide (CASODEX) 50 MG tablet; Take 1 tablet (50 mg total) by mouth daily. -     NEEDLE, DISP, 18 G 18G X 1" MISC; To draw estradiol valerate -     NEEDLE, DISP, 25 G (BD DISP NEEDLES) 25G X 5/8" MISC;  For subcutaneous use -     Syringe, Disposable, 1 ML MISC; For weekly use with estradiol valerate   Recheck liver function in 3 months, then Q646mos going forward   Patient education and anticipatory guidance given Patient agrees with treatment plan Follow-up in office in 6 months or sooner as needed if symptoms worsen or fail to improve  Levonne Hubertharley E. Cummings PA-C

## 2019-05-30 LAB — COMPLETE METABOLIC PANEL WITH GFR
AG Ratio: 1.7 (calc) (ref 1.0–2.5)
ALT: 9 U/L (ref 6–29)
AST: 14 U/L (ref 10–30)
Albumin: 4.3 g/dL (ref 3.6–5.1)
Alkaline phosphatase (APISO): 51 U/L (ref 31–125)
BUN: 12 mg/dL (ref 7–25)
CO2: 24 mmol/L (ref 20–32)
Calcium: 9.9 mg/dL (ref 8.6–10.2)
Chloride: 102 mmol/L (ref 98–110)
Creat: 0.85 mg/dL (ref 0.50–1.10)
GFR, Est African American: 105 mL/min/{1.73_m2} (ref >=60)
GFR, Est Non African American: 91 mL/min/{1.73_m2} (ref >=60)
Globulin: 2.6 g/dL (calc) (ref 1.9–3.7)
Glucose, Bld: 94 mg/dL (ref 65–139)
Potassium: 4.2 mmol/L (ref 3.5–5.3)
Sodium: 137 mmol/L (ref 135–146)
Total Bilirubin: 0.4 mg/dL (ref 0.2–1.2)
Total Protein: 6.9 g/dL (ref 6.1–8.1)

## 2019-07-24 ENCOUNTER — Other Ambulatory Visit: Payer: Self-pay | Admitting: Physician Assistant

## 2019-07-24 DIAGNOSIS — E349 Endocrine disorder, unspecified: Secondary | ICD-10-CM

## 2019-08-05 ENCOUNTER — Encounter: Payer: Self-pay | Admitting: Physician Assistant

## 2019-09-06 ENCOUNTER — Other Ambulatory Visit: Payer: Self-pay | Admitting: Physician Assistant

## 2019-09-06 DIAGNOSIS — E349 Endocrine disorder, unspecified: Secondary | ICD-10-CM

## 2019-09-08 NOTE — Telephone Encounter (Signed)
I did refill the Casodex but she actually should have enough of the estradiol.  A 5 mL vial was sent which should be enough for almost 5 months worth.  It looks like it is only been 3 months since it was sent.  We may need to call the pharmacy and verify this.

## 2019-09-08 NOTE — Telephone Encounter (Signed)
[place] requesting med refills for delestrogen inj and bicalutamide. Pls advise, thanks.

## 2019-09-09 ENCOUNTER — Other Ambulatory Visit: Payer: Self-pay | Admitting: Physician Assistant

## 2019-09-09 DIAGNOSIS — E349 Endocrine disorder, unspecified: Secondary | ICD-10-CM

## 2019-09-09 NOTE — Telephone Encounter (Signed)
Research officer, [employer]. As per pharmacist, estradiol rx was dispensed on 05/29/19. Pt requested the refill for the medication. Pharmacist was informed that refill request is too soon. Pt will be instructed by pharmacist to contact the office for add'l inquiries regarding med refill for estradiol.

## 2019-09-14 ENCOUNTER — Other Ambulatory Visit: Payer: Self-pay | Admitting: Physician Assistant

## 2019-09-14 DIAGNOSIS — E349 Endocrine disorder, unspecified: Secondary | ICD-10-CM

## 2019-11-09 ENCOUNTER — Other Ambulatory Visit: Payer: Self-pay | Admitting: Physician Assistant

## 2019-11-09 DIAGNOSIS — E349 Endocrine disorder, unspecified: Secondary | ICD-10-CM

## 2019-12-02 ENCOUNTER — Ambulatory Visit: Payer: Self-pay | Admitting: Osteopathic Medicine

## 2019-12-02 ENCOUNTER — Ambulatory Visit: Payer: Self-pay | Admitting: Physician Assistant

## 2019-12-25 ENCOUNTER — Other Ambulatory Visit: Payer: Self-pay | Admitting: Family Medicine

## 2019-12-25 DIAGNOSIS — E349 Endocrine disorder, unspecified: Secondary | ICD-10-CM

## 2020-01-08 ENCOUNTER — Other Ambulatory Visit: Payer: Self-pay | Admitting: *Deleted

## 2020-01-22 ENCOUNTER — Ambulatory Visit: Payer: Self-pay | Admitting: Osteopathic Medicine

## 2020-03-01 ENCOUNTER — Ambulatory Visit (INDEPENDENT_AMBULATORY_CARE_PROVIDER_SITE_OTHER): Payer: Self-pay | Admitting: Osteopathic Medicine

## 2020-03-01 ENCOUNTER — Encounter: Payer: Self-pay | Admitting: Osteopathic Medicine

## 2020-03-01 ENCOUNTER — Other Ambulatory Visit: Payer: Self-pay

## 2020-03-01 VITALS — BP 133/81 | HR 87 | Wt 204.1 lb

## 2020-03-01 DIAGNOSIS — E349 Endocrine disorder, unspecified: Secondary | ICD-10-CM

## 2020-03-01 DIAGNOSIS — Z5181 Encounter for therapeutic drug level monitoring: Secondary | ICD-10-CM

## 2020-03-01 MED ORDER — BD DISP NEEDLES 25G X 5/8" MISC
1 refills | Status: AC
Start: 2020-03-01 — End: ?

## 2020-03-01 MED ORDER — SYRINGE (DISPOSABLE) 1 ML MISC: 1 refills | Status: AC

## 2020-03-01 MED ORDER — BICALUTAMIDE 50 MG PO TABS: 50.0000 mg | ORAL_TABLET | Freq: Every day | ORAL | 1 refills | Status: DC

## 2020-03-01 MED ORDER — ESTRADIOL VALERATE 40 MG/ML IM OIL: TOPICAL_OIL | INTRAMUSCULAR | 3 refills | Status: DC

## 2020-03-01 MED ORDER — "NEEDLE (DISP) 18G X 1"" MISC": 1 refills | Status: AC

## 2020-03-01 NOTE — Patient Instructions (Signed)
Syringes - 1 to 3 mg with Luer Boykin Reaper

## 2020-03-01 NOTE — Progress Notes (Signed)
Stacie Burns is a 34 y.o. adult who presents to  [facility] at [facility]  today, 03/01/20, seeking care for the following: . Hormone therapy (AMAB transgender female) - on estradiol injections o Last CMP ok 05/2019     ASSESSMENT & PLAN with other pertinent history/findings:  The primary encounter diagnosis was Medication monitoring encounter. A diagnosis of Endocrine disorder, unspecified was also pertinent to this visit.  Pt will have more recent labs forwarded to [place] Otherwise no complaints!   Patient Instructions  Syringes - 1 to 3 mg with Luer Lock     No orders of the defined types were placed in this encounter.   Meds ordered this encounter  Medications  . Syringe, Disposable, 1 ML MISC    Sig: For weekly use with estradiol valerate    Dispense:  12 each    Refill:  1  . NEEDLE, DISP, 25 G (BD DISP NEEDLES) 25G X 5/8" MISC    Sig: For subcutaneous use    Dispense:  12 each    Refill:  1  . NEEDLE, DISP, 18 G 18G X 1" MISC    Sig: To draw estradiol valerate    Dispense:  12 each    Refill:  1  . estradiol valerate (DELESTROGEN) 40 MG/ML injection    Sig: INJECT 0.25MLS TOTAL (10MG  TOTAL) INTO THE MUSCLE EVERY 7 DAYS    Dispense:  5 mL    Refill:  3  . bicalutamide (CASODEX) 50 MG tablet    Sig: Take 1 tablet (50 mg total) by mouth daily.    Dispense:  90 tablet    Refill:  1       Follow-up instructions: Return in about 6 months (around 09/01/2020) for ANNUAL (call week prior to visit for lab orders).                                         BP 133/81 (BP Location: Right Arm, Patient Position: Sitting, Cuff Size: Large)   Pulse 87   Wt 204 lb 1.9 oz (92.6 kg)   BMI 30.14 kg/m   Current Meds  Medication Sig  . bicalutamide (CASODEX) 50 MG tablet Take 1 tablet by mouth once daily  . estradiol valerate (DELESTROGEN) 40 MG/ML injection INJECT 0.25MLS TOTAL (10MG  TOTAL)  INTO THE MUSCLE EVERY 7 DAYS  . NEEDLE, DISP, 18 G 18G X 1" MISC To draw estradiol valerate  . NEEDLE, DISP, 25 G (BD DISP NEEDLES) 25G X 5/8" MISC For subcutaneous use  . NON FORMULARY Patient is taking an antibiotic  . Syringe, Disposable, 1 ML MISC For weekly use with estradiol valerate    No results found for this or any previous visit (from the past 72 hour(s)).  No results found.  Depression screen [facility] 2/9 05/29/2019 10/24/2018  Decreased Interest 0 0  Down, Depressed, Hopeless 0 0  PHQ - 2 Score 0 0    No flowsheet data found.    All questions at time of visit were answered - patient instructed to contact office with any additional concerns or updates.  ER/RTC precautions were reviewed with the patient.  Please note: voice recognition software was used to produce this document, and typos may escape review. Please contact Dr. 07/29/2019 for any needed clarifications.

## 2020-03-22 ENCOUNTER — Telehealth: Payer: Self-pay | Admitting: Osteopathic Medicine

## 2020-03-22 NOTE — Telephone Encounter (Signed)
Can come to office and I'll take a look I need to know exact date of surgery and I need records from her surgeon in [place].Stacie KitchenMarland Burns

## 2020-03-22 NOTE — Telephone Encounter (Signed)
Pt. Called in this morning wanting to schedule an office visit to remove stiches from breast-lift surgery. She had the surgery in [place]. She can not find anyone around here willing to take stiches out.  She was advised that we would have to inform you and let you decide. I told her that someone would contact her on your decision.

## 2020-03-23 ENCOUNTER — Ambulatory Visit: Payer: Self-pay | Admitting: Osteopathic Medicine

## 2020-03-23 NOTE — Telephone Encounter (Signed)
Pt has an appt with provider for evaluation this afternoon.

## 2020-04-04 ENCOUNTER — Ambulatory Visit (HOSPITAL_COMMUNITY)
Admission: EM | Admit: 2020-04-04 | Discharge: 2020-04-04 | Disposition: A | Discharge status: Home / Self Care | Payer: Self-pay | Attending: Family Medicine | Admitting: Family Medicine

## 2020-04-04 ENCOUNTER — Encounter (HOSPITAL_COMMUNITY): Payer: Self-pay | Admitting: Family Medicine

## 2020-04-04 ENCOUNTER — Other Ambulatory Visit: Payer: Self-pay

## 2020-04-04 DIAGNOSIS — L089 Local infection of the skin and subcutaneous tissue, unspecified: Secondary | ICD-10-CM

## 2020-04-04 DIAGNOSIS — Z5189 Encounter for other specified aftercare: Secondary | ICD-10-CM

## 2020-04-04 MED ORDER — CEPHALEXIN 500 MG PO CAPS: 500.0000 mg | ORAL_CAPSULE | Freq: Four times a day (QID) | ORAL | 0 refills | Status: AC

## 2020-04-04 MED ORDER — IBUPROFEN 600 MG PO TABS: 600.0000 mg | ORAL_TABLET | Freq: Three times a day (TID) | ORAL | 0 refills | Status: AC | PRN

## 2020-04-04 NOTE — ED Triage Notes (Signed)
Pt states she had breast augmentation surgery 02/20/20 in [place] and states some redness has begun on left breast approx 3-4 days. Also reports yellow drainage from surgical site. Denies fever, chills, n/v/d.

## 2020-04-04 NOTE — Discharge Instructions (Addendum)
Treating for possible infection Take the antibiotics as prescribed. Ibuprofen for pain and swelling Warm compresses to the area Follow up as needed for continued or worsening symptoms

## 2020-04-05 NOTE — ED Provider Notes (Signed)
Stacie Burns    CSN: [id number] Arrival date & time: 04/04/20  1106      History   Chief Complaint Chief Complaint  Patient presents with  . Wound Check    HPI Stacie Burns is a 34 y.o. adult.   Patient is a 34 year old female presents today for possible infection post surgery.  She had breast augmentation surgery on 02/20/2020 in [place].  Approximately 3 to 4 days ago started with left breast swelling, redness and yellow drainage from surgical sites.  Symptoms have been constant.  She denies any associated fever, chills, body aches, nausea, vomiting.  ROS per HPI      History reviewed. No pertinent past medical history.  Patient Active Problem List   Diagnosis Date Noted  . Medication monitoring encounter 05/29/2019  . Endocrine disorder, unspecified 10/24/2018    Past Surgical History:  Procedure Laterality Date  . THROAT SURGERY    . TRACHEAL SURGERY      OB History   No obstetric history on file.      Home Medications    Prior to Admission medications   Medication Sig Start Date End Date Taking? Authorizing Provider  bicalutamide (CASODEX) 50 MG tablet Take 1 tablet (50 mg total) by mouth daily. 03/01/20  Yes Emeterio Reeve, DO  estradiol valerate (DELESTROGEN) 40 MG/ML injection INJECT 0.25MLS TOTAL (10MG  TOTAL) INTO THE MUSCLE EVERY 7 DAYS 03/01/20  Yes Emeterio Reeve, DO  cephALEXin (KEFLEX) 500 MG capsule Take 1 capsule (500 mg total) by mouth 4 (four) times daily. 04/04/20   Loura Halt A, NP  ibuprofen (ADVIL) 600 MG tablet Take 1 tablet (600 mg total) by mouth every 8 (eight) hours as needed for moderate pain. 04/04/20   Orvan July, NP  NEEDLE, DISP, 18 G 18G X 1" MISC To draw estradiol valerate 03/01/20   Emeterio Reeve, DO  NEEDLE, DISP, 25 G (BD DISP NEEDLES) 25G X 5/8" MISC For subcutaneous use 03/01/20   Emeterio Reeve, DO  NON FORMULARY Patient is taking an antibiotic    [provider]  Syringe, Disposable,  1 ML MISC For weekly use with estradiol valerate 03/01/20   Emeterio Reeve, DO    Family History Family History  Problem Relation Age of Onset  . Diabetes Maternal Grandmother   . Breast cancer Other   . Ovarian cancer Other     Social History Social History   Tobacco Use  . Smoking status: Former Smoker    Packs/day: 1.00    Years: 6.00    Pack years: 6.00    Quit date: 02/15/2013    Years since quitting: 7.1  . Smokeless tobacco: Never Used  Substance Use Topics  . Alcohol use: Yes    Comment: ocassionally, socially  . Drug use: Never     Allergies   Patient has no known allergies.   Review of Systems Review of Systems   Physical Exam Triage Vital Signs ED Triage Vitals  Enc Vitals Group     BP 04/04/20 1215 130/87     Pulse Rate 04/04/20 1215 70     Resp 04/04/20 1215 18     Temp 04/04/20 1215 98.4 F (36.9 C)     Temp Source 04/04/20 1215 Oral     SpO2 04/04/20 1215 100 %     Weight --      Height --      Head Circumference --      Peak Flow --  Pain Score 04/04/20 1213 7     Pain Loc --      Pain Edu? --      Excl. in GC? --    No data found.  Updated Vital Signs BP 130/87 (BP Location: Right Arm)   Pulse 70   Temp 98.4 F (36.9 C) (Oral)   Resp 18   SpO2 100%   Visual Acuity Right Eye Distance:   Left Eye Distance:   Bilateral Distance:    Right Eye Near:   Left Eye Near:    Bilateral Near:     Physical Exam Vitals and nursing note reviewed.  Constitutional:      General: She is not in acute distress.    Appearance: Normal appearance. She is not ill-appearing, toxic-appearing or diaphoretic.  HENT:     Head: Normocephalic and atraumatic.     Nose: Nose normal.  Eyes:     Conjunctiva/sclera: Conjunctivae normal.  Pulmonary:     Effort: Pulmonary effort is normal.  Chest:     Comments: Swelling to the left breast with mild erythema, generalized Some drainage from open areas of incision.  Abdominal:     Palpations:  Abdomen is soft.  Musculoskeletal:        General: Normal range of motion.     Cervical back: Normal range of motion.  Skin:    General: Skin is warm and dry.     Findings: No rash.  Neurological:     Mental Status: She is alert.  Psychiatric:        Mood and Affect: Mood normal.      UC Treatments / Results  Labs (all labs ordered are listed, but only abnormal results are displayed) Labs Reviewed - No data to display  EKG   Radiology No results found.  Procedures Procedures (including critical care time)  Medications Ordered in UC Medications - No data to display  Initial Impression / Assessment and Plan / UC Course  I have reviewed the triage vital signs and the nursing notes.  Pertinent labs & imaging results that were available during my care of the patient were reviewed by me and considered in my medical decision making (see chart for details).     Wound check infection to left breast with the increased swelling, pain, redness and drainage from incision sites. Treating with Keflex to cover for infection Ibuprofen for pain as needed Warm compresses to the area Recommended follow-up plastic surgery if symptoms continue or worsen. Final Clinical Impressions(s) / UC Diagnoses   Final diagnoses:  Visit for wound check     Discharge Instructions     Treating for possible infection Take the antibiotics as prescribed. Ibuprofen for pain and swelling Warm compresses to the area Follow up as needed for continued or worsening symptoms     ED Prescriptions    Medication Sig Dispense Auth. Provider   cephALEXin (KEFLEX) 500 MG capsule Take 1 capsule (500 mg total) by mouth 4 (four) times daily. 28 capsule Alyna Stensland A, NP   ibuprofen (ADVIL) 600 MG tablet Take 1 tablet (600 mg total) by mouth every 8 (eight) hours as needed for moderate pain. 30 tablet Dahlia Byes A, NP     PDMP not reviewed this encounter.   Janace Aris, NP 04/05/20 [phone]

## 2021-03-22 ENCOUNTER — Other Ambulatory Visit: Payer: Self-pay | Admitting: Osteopathic Medicine

## 2021-03-22 DIAGNOSIS — Z5181 Encounter for therapeutic drug level monitoring: Secondary | ICD-10-CM

## 2021-03-22 DIAGNOSIS — E349 Endocrine disorder, unspecified: Secondary | ICD-10-CM

## 2021-03-24 ENCOUNTER — Other Ambulatory Visit: Payer: Self-pay | Admitting: Osteopathic Medicine

## 2021-03-24 DIAGNOSIS — E349 Endocrine disorder, unspecified: Secondary | ICD-10-CM

## 2021-03-24 DIAGNOSIS — Z5181 Encounter for therapeutic drug level monitoring: Secondary | ICD-10-CM

## 2021-03-25 NOTE — Telephone Encounter (Signed)
Not seen >1 year Refill 30 days but must have visit in that time for [facility]

## 2021-03-30 NOTE — Telephone Encounter (Signed)
Called patient regarding med refill and making an appointment for annual physical. Patient was agreeable with provider's plan and will call to make an appointment. Patient is okay with provider sending in a 30 day refill for estradiol valerate.

## 2021-06-08 ENCOUNTER — Telehealth: Payer: Self-pay | Admitting: Osteopathic Medicine

## 2021-06-08 DIAGNOSIS — E349 Endocrine disorder, unspecified: Secondary | ICD-10-CM

## 2021-06-08 DIAGNOSIS — Z5181 Encounter for therapeutic drug level monitoring: Secondary | ICD-10-CM

## 2021-06-08 MED ORDER — ESTRADIOL VALERATE 40 MG/ML IM OIL: TOPICAL_OIL | INTRAMUSCULAR | 0 refills | Status: AC

## 2021-06-08 MED ORDER — BICALUTAMIDE 50 MG PO TABS: 50.0000 mg | ORAL_TABLET | Freq: Every day | ORAL | 0 refills | Status: AC

## 2021-06-08 NOTE — Telephone Encounter (Signed)
Patient moved to [place] but she wanted to know if she can do a virtual until she finds a doctor where she is. She needs a refill on her medication for HRT. Please advise.

## 2021-06-08 NOTE — Telephone Encounter (Signed)
Pt is requesting bicalutamide and delestrogen. Preferred pharmacy updated in patient's chart. Please send to [facility] pharmacy [phone]).
